# Patient Record
Sex: Female | Born: 1985 | Hispanic: Yes | Marital: Married | Smoking: Never smoker
Health system: Southern US, Community
[De-identification: ages and names within clinical notes are randomized; demographics above are authoritative.]

## PROBLEM LIST (undated history)

## (undated) DIAGNOSIS — R011 Cardiac murmur, unspecified: Secondary | ICD-10-CM

## (undated) HISTORY — DX: Cardiac murmur, unspecified: R01.1

## (undated) HISTORY — PX: WISDOM TOOTH EXTRACTION: SHX21

---

## 2016-10-18 ENCOUNTER — Encounter: Payer: Self-pay | Admitting: Family Medicine

## 2016-12-18 ENCOUNTER — Encounter: Payer: Self-pay | Admitting: Family Medicine

## 2016-12-18 ENCOUNTER — Ambulatory Visit (INDEPENDENT_AMBULATORY_CARE_PROVIDER_SITE_OTHER): Payer: 59 | Admitting: Family Medicine

## 2016-12-18 VITALS — BP 121/80 | HR 65 | Temp 97.8°F | Resp 16 | Ht 63.39 in | Wt 155.4 lb

## 2016-12-18 DIAGNOSIS — M62838 Other muscle spasm: Secondary | ICD-10-CM | POA: Diagnosis not present

## 2016-12-18 DIAGNOSIS — Z833 Family history of diabetes mellitus: Secondary | ICD-10-CM

## 2016-12-18 DIAGNOSIS — Z124 Encounter for screening for malignant neoplasm of cervix: Secondary | ICD-10-CM

## 2016-12-18 DIAGNOSIS — Z0000 Encounter for general adult medical examination without abnormal findings: Secondary | ICD-10-CM | POA: Diagnosis not present

## 2016-12-18 NOTE — Patient Instructions (Signed)
     IF you received an x-ray today, you will receive an invoice from [facility]. Please contact [facility] at [phone] with questions or concerns regarding your invoice.   IF you received labwork today, you will receive an invoice from [facility]. Please contact [facility] at [phone] with questions or concerns regarding your invoice.   Our billing staff will not be able to assist you with questions regarding bills from these companies.  You will be contacted with the lab results as soon as they are available. The fastest way to get your results is to activate your My Chart account. Instructions are located on the last page of this paperwork. If you have not heard from us regarding the results in 2 weeks, please contact this office.     

## 2016-12-18 NOTE — Progress Notes (Signed)
Chief Complaint  Patient presents with  . New Patient (Initial Visit)    establish care    HPI   New patient with new concern of chronic back pain for more than 6 months related to sitting for long periods and teds to be in the mid-thoracic.  This is her first evaluation for this pain.  She is mostly at a computer and does typing and phone calls.  She reports that it is hard to get a comfortable position to sleep.   She has never had a pap smear  She reports that she had abnormal periods Patient's last menstrual period was 09/17/2016. She reports that now her periods occur every 3-4 months G0P0 Sexually active with female spouse  Her last eye exam was 12 months ago Dental exam not up to date Exercise: 3-4 times a week   No past medical history on file.  No current outpatient prescriptions on file.   No current facility-administered medications for this visit.     Allergies: No Known Allergies  No past surgical history on file.  Social History   Social History  . Marital status: Married    Spouse name: N/A  . Number of children: N/A  . Years of education: N/A   Social History Main Topics  . Smoking status: Light Tobacco Smoker    Types: Cigarettes  . Smokeless tobacco: Never Used     Comment: 1-2 cigarettes a month in college, no longer smoking  . Alcohol use 0.6 oz/week    1 Cans of beer per week     Comment: one beer per month  . Drug use: No  . Sexual activity: Yes   Other Topics Concern  . None   Social History Narrative  . None   Family History  Problem Relation Age of Onset  . Idiopathic pulmonary fibrosis Mother   . Diabetes Father   . Diabetes Brother   . Cancer Paternal Grandfather   . Alzheimer's disease Paternal Grandfather     Review of Systems  Constitutional: Negative for chills, fever and weight loss.  Eyes: Negative for blurred vision, double vision and pain.  Respiratory: Negative for cough, shortness of breath and wheezing.    Cardiovascular: Negative for chest pain, palpitations and leg swelling.  Gastrointestinal: Negative for abdominal pain, diarrhea, heartburn, nausea and vomiting.  Genitourinary: Negative for dysuria, hematuria and urgency.  Musculoskeletal: Positive for back pain.  Skin: Negative for itching and rash.  Neurological: Negative for dizziness, tingling, tremors and headaches.  Psychiatric/Behavioral: Negative for depression. The patient is not nervous/anxious and does not have insomnia.     Objective: Vitals:   12/18/16 1652  BP: 121/80  Pulse: 65  Resp: 16  Temp: 97.8 F (36.6 C)  TempSrc: Oral  SpO2: 97%  Weight: 155 lb 6.4 oz (70.5 kg)  Height: 5' 3.39" (1.61 m)  Body mass index is 27.19 kg/m.   Physical Exam  Constitutional: She is oriented to person, place, and time. She appears well-developed and well-nourished.  HENT:  Head: Normocephalic and atraumatic.  Right Ear: External ear normal.  Left Ear: External ear normal.  Nose: Nose normal.  Mouth/Throat: Oropharynx is clear and moist.  Eyes: Conjunctivae and EOM are normal.  Neck: Normal range of motion. No thyromegaly present.  Cardiovascular: Normal rate, regular rhythm and normal heart sounds.   Pulmonary/Chest: Effort normal and breath sounds normal. No respiratory distress. She has no wheezes. She has no rales.  Abdominal: Soft. Bowel sounds are normal.  Musculoskeletal:       Back:  Neurological: She is alert and oriented to person, place, and time.   Vaginal exam Chaperone present Labia normal bilaterally without skin lesions Urethral meatus normal appearing without erythema Vagina with discharge, no odor Deferred bimanual exam due to patient discomfort  Assessment and Plan Jearld AdjutantMarina was seen today for new patient (initial visit).  Diagnoses and all orders for this visit:  Encounter for health maintenance examination in adult- age appropriate screenings reviewed -     Hemoglobin A1c -     Lipid panel -      TSH  Papanicolaou smear for cervical cancer screening -     Pap IG w/ reflex to HPV when ASC-U  Family history of diabetes mellitus -     Hemoglobin A1c  Trapezius muscle spasm- discussed physical therapy and posture -     Ambulatory referral to Physical Therapy     Keeton Kassebaum A Creta LevinStallings

## 2016-12-19 LAB — PAP IG W/ RFLX HPV ASCU: PAP Smear Comment: 0

## 2016-12-19 LAB — LIPID PANEL
CHOLESTEROL TOTAL: 169 mg/dL (ref 100–199)
Chol/HDL Ratio: 2.8 ratio (ref 0.0–4.4)
HDL: 61 mg/dL (ref >39)
LDL Calculated: 90 mg/dL (ref 0–99)
TRIGLYCERIDES: 89 mg/dL (ref 0–149)
VLDL Cholesterol Cal: 18 mg/dL (ref 5–40)

## 2016-12-19 LAB — HEMOGLOBIN A1C
ESTIMATED AVERAGE GLUCOSE: 103 mg/dL
HEMOGLOBIN A1C: 5.2 % (ref 4.8–5.6)

## 2016-12-19 LAB — TSH: TSH: 1.72 u[IU]/mL (ref 0.450–4.500)

## 2016-12-22 ENCOUNTER — Other Ambulatory Visit: Payer: Self-pay | Admitting: Family Medicine

## 2016-12-22 MED ORDER — FLUCONAZOLE 150 MG PO TABS: 150.0000 mg | ORAL_TABLET | Freq: Once | ORAL | 0 refills | Status: AC

## 2016-12-22 NOTE — Progress Notes (Signed)
Diflucan sent in

## 2017-02-19 DIAGNOSIS — M2560 Stiffness of unspecified joint, not elsewhere classified: Secondary | ICD-10-CM | POA: Diagnosis not present

## 2018-01-29 NOTE — Progress Notes (Signed)
Chief Complaint  Patient presents with  . pain in left side of lower back    Onset: tuesday night pain started on left lower back then moved to right side of lower back then to mid abdomen- per pt she thought she would have to go to ED due to the severity of the pain but by Wed. morning pain was gone.  Per pt 1st felt pain in July but went away    HPI  Patient reports that she has been having pain in her abdomen that moves around She states that the first episode was July  She states that this episode was 3 days ago and last 2 days The pain was sharp and moved to her back at times   History reviewed. No pertinent past medical history.  Current Outpatient Medications  Medication Sig Dispense Refill  . ibuprofen (ADVIL,MOTRIN) 200 MG tablet Take 200 mg by mouth every 6 (six) hours as needed.     No current facility-administered medications for this visit.     Allergies: No Known Allergies  History reviewed. No pertinent surgical history.  Social History   Socioeconomic History  . Marital status: Married    Spouse name: Not on file  . Number of children: Not on file  . Years of education: Not on file  . Highest education level: Not on file  Occupational History  . Not on file  Social Needs  . Financial resource strain: Not on file  . Food insecurity:    Worry: Not on file    Inability: Not on file  . Transportation needs:    Medical: Not on file    Non-medical: Not on file  Tobacco Use  . Smoking status: Light Tobacco Smoker    Types: Cigarettes  . Smokeless tobacco: Never Used  . Tobacco comment: 1-2 cigarettes a month in college, no longer smoking  Substance and Sexual Activity  . Alcohol use: Yes    Alcohol/week: 1.0 standard drinks    Types: 1 Cans of beer per week    Comment: one beer per month  . Drug use: No  . Sexual activity: Yes  Lifestyle  . Physical activity:    Days per week: Not on file    Minutes per session: Not on file  . Stress: Not on  file  Relationships  . Social connections:    Talks on phone: Not on file    Gets together: Not on file    Attends religious service: Not on file    Active member of club or organization: Not on file    Attends meetings of clubs or organizations: Not on file    Relationship status: Not on file  Other Topics Concern  . Not on file  Social History Narrative  . Not on file    Family History  Problem Relation Age of Onset  . Idiopathic pulmonary fibrosis Mother   . Diabetes Father   . Diabetes Brother   . Cancer Paternal Grandfather   . Alzheimer's disease Paternal Grandfather      ROS Review of Systems See HPI Constitution: No fevers or chills No malaise No diaphoresis Skin: No rash or itching Eyes: no blurry vision, no double vision GU: no dysuria or hematuria Neuro: no dizziness or headaches all others reviewed and negative   Objective: Vitals:   01/30/18 1220  BP: 132/85  Pulse: 66  Resp: 17  Temp: 98.1 F (36.7 C)  TempSrc: Oral  SpO2: 99%  Weight: 163 lb (  73.9 kg)  Height: 5' 3.39" (1.61 m)    Physical Exam  Constitutional: She is oriented to person, place, and time. She appears well-developed and well-nourished.  HENT:  Head: Normocephalic and atraumatic.  Eyes: Conjunctivae and EOM are normal.  Cardiovascular: Normal rate, regular rhythm and normal heart sounds.  No murmur heard. Pulmonary/Chest: Effort normal and breath sounds normal. No stridor. No respiratory distress.  Neurological: She is alert and oriented to person, place, and time.  Skin: Skin is warm. Capillary refill takes less than 2 seconds.  Psychiatric: She has a normal mood and affect. Her behavior is normal. Judgment and thought content normal.    Assessment and Plan Jearld AdjutantMarina was seen today for pain in left side of lower back.  Diagnoses and all orders for this visit:  Generalized abdominal pain -     DG Abd 1 View; Future   -  Shows gas in the colon  Will recommend pepcid  and metamucil for 2 weeks  Hampton Wixom A Chloe Flis

## 2018-01-30 ENCOUNTER — Encounter: Payer: Self-pay | Admitting: Family Medicine

## 2018-01-30 ENCOUNTER — Ambulatory Visit: Payer: 59 | Admitting: Family Medicine

## 2018-01-30 ENCOUNTER — Other Ambulatory Visit: Payer: Self-pay

## 2018-01-30 ENCOUNTER — Ambulatory Visit (INDEPENDENT_AMBULATORY_CARE_PROVIDER_SITE_OTHER): Payer: 59

## 2018-01-30 VITALS — BP 132/85 | HR 66 | Temp 98.1°F | Resp 17 | Ht 63.39 in | Wt 163.0 lb

## 2018-01-30 DIAGNOSIS — R1084 Generalized abdominal pain: Secondary | ICD-10-CM | POA: Diagnosis not present

## 2018-01-30 DIAGNOSIS — R109 Unspecified abdominal pain: Secondary | ICD-10-CM | POA: Diagnosis not present

## 2018-01-30 NOTE — Patient Instructions (Addendum)
Gas distention Try metamucil twice a day before meals for two weeks pepcid every morning for a week  Increase water intake   If you have lab work done today you will be contacted with your lab results within the next 2 weeks.  If you have not heard from us then please contact us. The fastest way to get your results is to register for My Chart.   IF you received an x-ray today, you will receive an invoice from [facility]. Please contact [facility] at [phone] with questions or concerns regarding your invoice.   IF you received labwork today, you will receive an invoice from [facility]. Please contact [facility] at [phone] with questions or concerns regarding your invoice.   Our billing staff will not be able to assist you with questions regarding bills from these companies.  You will be contacted with the lab results as soon as they are available. The fastest way to get your results is to activate your My Chart account. Instructions are located on the last page of this paperwork. If you have not heard from us regarding the results in 2 weeks, please contact this office.

## 2018-04-15 ENCOUNTER — Encounter: Payer: Self-pay | Admitting: Family Medicine

## 2018-04-15 ENCOUNTER — Ambulatory Visit: Payer: 59 | Admitting: Family Medicine

## 2018-04-15 ENCOUNTER — Other Ambulatory Visit: Payer: Self-pay

## 2018-04-15 DIAGNOSIS — R1032 Left lower quadrant pain: Secondary | ICD-10-CM

## 2018-04-15 LAB — POCT URINALYSIS DIP (MANUAL ENTRY)
BILIRUBIN UA: NEGATIVE
Glucose, UA: NEGATIVE mg/dL
Ketones, POC UA: NEGATIVE mg/dL
Nitrite, UA: NEGATIVE
PH UA: 7 (ref 5.0–8.0)
Protein Ur, POC: 30 mg/dL — AB
SPEC GRAV UA: 1.02 (ref 1.010–1.025)
Urobilinogen, UA: 0.2 E.U./dL

## 2018-04-15 NOTE — Progress Notes (Signed)
Established Patient Office Visit  Subjective:  Patient ID: Tiffany Guerrero, female    DOB: 1986-03-12  Age: 32 y.o. MRN: [id number]  CC:  Chief Complaint  Patient presents with  . Abdominal Pain    pt states the pain has been present since Sept. that starts in the lower back and radiates around into the abdomin. Pt states she feels blotting and then the pain.     HPI Tiffany Guerrero presents for   Patient reports that she still gets pain in her back and abdomen States that when she feels very bloated and gets severe pains and spasms She denies bloody BM She denies nausea or vomiting, fevers or chills.   She recalls that the first episode was back in July then worsened until September then improved and now is back. She has not family history of colon issues. She eats cheese, lots of cheese, drinks almond milk, and tried metamucil capsule once or twice. She tries to increase her water intake.    No past medical history on file.  No past surgical history on file.  Family History  Problem Relation Age of Onset  . Idiopathic pulmonary fibrosis Mother   . Diabetes Father   . Diabetes Brother   . Cancer Paternal Grandfather   . Alzheimer's disease Paternal Grandfather     Social History   Socioeconomic History  . Marital status: Married    Spouse name: Not on file  . Number of children: Not on file  . Years of education: Not on file  . Highest education level: Not on file  Occupational History  . Not on file  Social Needs  . Financial resource strain: Not on file  . Food insecurity:    Worry: Not on file    Inability: Not on file  . Transportation needs:    Medical: Not on file    Non-medical: Not on file  Tobacco Use  . Smoking status: Light Tobacco Smoker    Types: Cigarettes  . Smokeless tobacco: Never Used  . Tobacco comment: 1-2 cigarettes a month in college, no longer smoking  Substance and Sexual Activity  . Alcohol use: Yes   Alcohol/week: 1.0 standard drinks    Types: 1 Cans of beer per week    Comment: one beer per month  . Drug use: No  . Sexual activity: Yes  Lifestyle  . Physical activity:    Days per week: Not on file    Minutes per session: Not on file  . Stress: Not on file  Relationships  . Social connections:    Talks on phone: Not on file    Gets together: Not on file    Attends religious service: Not on file    Active member of club or organization: Not on file    Attends meetings of clubs or organizations: Not on file    Relationship status: Not on file  . Intimate partner violence:    Fear of current or ex partner: Not on file    Emotionally abused: Not on file    Physically abused: Not on file    Forced sexual activity: Not on file  Other Topics Concern  . Not on file  Social History Narrative  . Not on file    Outpatient Medications Prior to Visit  Medication Sig Dispense Refill  . ibuprofen (ADVIL,MOTRIN) 200 MG tablet Take 200 mg by mouth every 6 (six) hours as needed.     No facility-administered medications prior to visit.  No Known Allergies  ROS Review of Systems Review of Systems  Constitutional: Negative for activity change, appetite change, chills and fever.  HENT: Negative for congestion, nosebleeds, trouble swallowing and voice change.   Respiratory: Negative for cough, shortness of breath and wheezing.   Gastrointestinal: see hpi  Genitourinary: Negative for difficulty urinating, dysuria, flank pain and hematuria.  Musculoskeletal: Negative for back pain, joint swelling and neck pain.  Neurological: Negative for dizziness, speech difficulty, light-headedness and numbness.  See HPI. All other review of systems negative.     Objective:   BP 124/85   Pulse 73   Temp 98 F (36.7 C) (Oral)   Resp 16   Ht 5' 4.17" (1.63 m)   Wt 158 lb (71.7 kg)   LMP 03/18/2018 (Approximate)   SpO2 98%   BMI 26.97 kg/m  Wt Readings from Last 3 Encounters:  04/15/18  158 lb (71.7 kg)  01/30/18 163 lb (73.9 kg)  12/18/16 155 lb 6.4 oz (70.5 kg)   Physical Exam  Constitutional: She appears well-developed and well-nourished.  HENT:  Head: Normocephalic and atraumatic.  Cardiovascular: Normal rate, regular rhythm and normal heart sounds.  No murmur heard. Pulmonary/Chest: Effort normal and breath sounds normal. No respiratory distress. She has no wheezes.  Abdominal: Normal appearance.       Health Maintenance Due  Topic Date Due  . HIV Screening  03/22/2003  . TETANUS/TDAP  03/22/2007    There are no preventive care reminders to display for this patient.  Lab Results  Component Value Date   TSH 1.720 12/18/2016   Lab Results  Component Value Date   WBC 7.7 04/15/2018   HGB 13.2 04/15/2018   HCT 37.5 04/15/2018   MCV 92 04/15/2018   PLT 257 04/15/2018   Lab Results  Component Value Date   NA 137 04/15/2018   K 4.3 04/15/2018   CO2 19 (L) 04/15/2018   GLUCOSE 95 04/15/2018   BUN 9 04/15/2018   CREATININE 0.58 04/15/2018   BILITOT 0.4 04/15/2018   ALKPHOS 82 04/15/2018   AST 34 04/15/2018   ALT 55 (H) 04/15/2018   PROT 6.8 04/15/2018   ALBUMIN 4.5 04/15/2018   CALCIUM 9.5 04/15/2018   Lab Results  Component Value Date   CHOL 169 12/18/2016   Lab Results  Component Value Date   HDL 61 12/18/2016   Lab Results  Component Value Date   LDLCALC 90 12/18/2016   Lab Results  Component Value Date   TRIG 89 12/18/2016   Lab Results  Component Value Date   CHOLHDL 2.8 12/18/2016   Lab Results  Component Value Date   HGBA1C 5.2 12/18/2016      Assessment & Plan:   Problem List Items Addressed This Visit      Other   Left lower quadrant abdominal pain    Previously treated for gas and obstipation. Advised patient to follow up with CT abdomen as the pain is vague but persistent and does not seem to have triggers or aggravating factors. Her age and lack of risk factors were reassuring thus an outpatient evaluation  was advised with CT abdomen/pelvs with contrast.       Relevant Orders   CT Abdomen Pelvis W Contrast   CBC (Completed)   CMP14+EGFR (Completed)   Allergen Profile, Food-Milk (Completed)   POCT urinalysis dipstick (Completed)      No orders of the defined types were placed in this encounter.   Follow-up: No follow-ups on file.  Ulyana Pitones A Kenesha Moshier, MD 

## 2018-04-15 NOTE — Patient Instructions (Addendum)
If you have lab work done today you will be contacted with your lab results within the next 2 weeks.  If you have not heard from us then please contact us. The fastest way to get your results is to register for My Chart.   IF you received an x-ray today, you will receive an invoice from [facility]. Please contact [facility] at [phone] with questions or concerns regarding your invoice.   IF you received labwork today, you will receive an invoice from [facility]. Please contact [facility] at [phone] with questions or concerns regarding your invoice.   Our billing staff will not be able to assist you with questions regarding bills from these companies.  You will be contacted with the lab results as soon as they are available. The fastest way to get your results is to activate your My Chart account. Instructions are located on the last page of this paperwork. If you have not heard from us regarding the results in 2 weeks, please contact this office.      Abdominal Bloating When you have abdominal bloating, your abdomen may feel full, tight, or painful. It may also look bigger than normal or swollen (distended). Common causes of abdominal bloating include:  Swallowing air.  Constipation.  Problems digesting food.  Eating too much.  Irritable bowel syndrome. This is a condition that affects the large intestine.  Lactose intolerance. This is an inability to digest lactose, a natural sugar in dairy products.  Celiac disease. This is a condition that affects the ability to digest gluten, a protein found in some grains.  Gastroparesis. This is a condition that slows down the movement of food in the stomach and small intestine. It is more common in people with diabetes mellitus.  Gastroesophageal reflux disease (GERD). This is a digestive condition that makes stomach acid flow back into the esophagus.  Urinary retention. This means that the body is holding  onto urine, and the bladder cannot be emptied all the way.  Follow these instructions at home: Eating and drinking  Avoid eating too much.  Try not to swallow air while talking or eating.  Avoid eating while lying down.  Avoid these foods and drinks: ? Foods that cause gas, such as broccoli, cabbage, cauliflower, and baked beans. ? Carbonated drinks. ? Hard candy. ? Chewing gum. Medicines  Take over-the-counter and prescription medicines only as told by your health care provider.  Take probiotic medicines. These medicines contain live bacteria or yeasts that can help digestion.  Take coated peppermint oil capsules. Activity  Try to exercise regularly. Exercise may help to relieve bloating that is caused by gas and relieve constipation. General instructions  Keep all follow-up visits as told by your health care provider. This is important. Contact a health care provider if:  You have nausea and vomiting.  You have diarrhea.  You have abdominal pain.  You have unusual weight loss or weight gain.  You have severe pain, and medicines do not help. Get help right away if:  You have severe chest pain.  You have trouble breathing.  You have shortness of breath.  You have trouble urinating.  You have darker urine than normal.  You have blood in your stools or have dark, tarry stools. Summary  Abdominal bloating means that the abdomen is swollen.  Common causes of abdominal bloating are swallowing air, constipation, and problems digesting food.  Avoid eating too much and avoid swallowing air.  Avoid foods that cause  gas, carbonated drinks, hard candy, and chewing gum. This information is not intended to replace advice given to you by your health care provider. Make sure you discuss any questions you have with your health care provider. Document Released: 06/07/2016 Document Revised: 06/07/2016 Document Reviewed: 06/07/2016 Elsevier Interactive Patient Education   [employer].

## 2018-04-16 ENCOUNTER — Encounter: Payer: Self-pay | Admitting: Family Medicine

## 2018-04-17 DIAGNOSIS — R1032 Left lower quadrant pain: Secondary | ICD-10-CM | POA: Insufficient documentation

## 2018-04-17 NOTE — Assessment & Plan Note (Signed)
Previously treated for gas and obstipation. Advised patient to follow up with CT abdomen as the pain is vague but persistent and does not seem to have triggers or aggravating factors. Her age and lack of risk factors were reassuring thus an outpatient evaluation was advised with CT abdomen/pelvs with contrast.

## 2018-04-18 LAB — ALLERGEN PROFILE, FOOD-MILK
F076-IgE Alpha Lactalbumin: <0.1 kU/L
F081-IgE Cheese, Cheddar Type: <0.1 kU/L
Milk IgE: <0.1 kU/L

## 2018-04-18 LAB — CMP14+EGFR
ALBUMIN: 4.5 g/dL (ref 3.5–5.5)
ALT: 55 IU/L — ABNORMAL HIGH (ref 0–32)
AST: 34 IU/L (ref 0–40)
Albumin/Globulin Ratio: 2 (ref 1.2–2.2)
Alkaline Phosphatase: 82 IU/L (ref 39–117)
BUN / CREAT RATIO: 16 (ref 9–23)
BUN: 9 mg/dL (ref 6–20)
Bilirubin Total: 0.4 mg/dL (ref 0.0–1.2)
CALCIUM: 9.5 mg/dL (ref 8.7–10.2)
CO2: 19 mmol/L — AB (ref 20–29)
CREATININE: 0.58 mg/dL (ref 0.57–1.00)
Chloride: 103 mmol/L (ref 96–106)
GFR, EST AFRICAN AMERICAN: 143 mL/min/{1.73_m2} (ref >59)
GFR, EST NON AFRICAN AMERICAN: 124 mL/min/{1.73_m2} (ref >59)
Globulin, Total: 2.3 g/dL (ref 1.5–4.5)
Glucose: 95 mg/dL (ref 65–99)
Potassium: 4.3 mmol/L (ref 3.5–5.2)
Sodium: 137 mmol/L (ref 134–144)
TOTAL PROTEIN: 6.8 g/dL (ref 6.0–8.5)

## 2018-04-18 LAB — CBC
HEMOGLOBIN: 13.2 g/dL (ref 11.1–15.9)
Hematocrit: 37.5 % (ref 34.0–46.6)
MCH: 32.4 pg (ref 26.6–33.0)
MCHC: 35.2 g/dL (ref 31.5–35.7)
MCV: 92 fL (ref 79–97)
Platelets: 257 10*3/uL (ref 150–450)
RBC: 4.07 x10E6/uL (ref 3.77–5.28)
RDW: 12.8 % (ref 12.3–15.4)
WBC: 7.7 10*3/uL (ref 3.4–10.8)

## 2018-05-06 ENCOUNTER — Ambulatory Visit
Admission: RE | Admit: 2018-05-06 | Discharge: 2018-05-06 | Disposition: A | Discharge status: Home / Self Care | Payer: 59 | Source: Ambulatory Visit | Attending: Family Medicine | Admitting: Family Medicine

## 2018-05-06 DIAGNOSIS — R1032 Left lower quadrant pain: Secondary | ICD-10-CM

## 2018-05-06 DIAGNOSIS — K760 Fatty (change of) liver, not elsewhere classified: Secondary | ICD-10-CM | POA: Diagnosis not present

## 2018-05-06 MED ORDER — IOPAMIDOL (ISOVUE-300) INJECTION 61%
100.0000 mL | Freq: Once | INTRAVENOUS | Status: AC | PRN
  Administered 2018-05-06: 100 mL via INTRAVENOUS

## 2018-07-08 ENCOUNTER — Telehealth: Payer: Self-pay | Admitting: Family Medicine

## 2018-07-08 NOTE — Telephone Encounter (Signed)
Called to regarding their appt with Dr. Creta Levin on 4/4. I was able to reschedule them for 3/30 with Dr. Creta Levin. I advised of time and late policy. Pt acknowledged.

## 2018-08-11 ENCOUNTER — Other Ambulatory Visit: Payer: Self-pay | Admitting: *Deleted

## 2018-08-17 ENCOUNTER — Ambulatory Visit: Payer: 59 | Admitting: Family Medicine

## 2018-08-22 ENCOUNTER — Ambulatory Visit: Payer: 59 | Admitting: Family Medicine

## 2018-11-16 ENCOUNTER — Other Ambulatory Visit: Payer: Self-pay

## 2018-11-16 ENCOUNTER — Encounter (HOSPITAL_COMMUNITY): Payer: Self-pay

## 2018-11-16 ENCOUNTER — Ambulatory Visit (HOSPITAL_COMMUNITY)
Admission: EM | Admit: 2018-11-16 | Discharge: 2018-11-16 | Disposition: A | Discharge status: Home / Self Care | Payer: 59 | Attending: Family Medicine | Admitting: Family Medicine

## 2018-11-16 DIAGNOSIS — R1010 Upper abdominal pain, unspecified: Secondary | ICD-10-CM | POA: Diagnosis not present

## 2018-11-16 DIAGNOSIS — R42 Dizziness and giddiness: Secondary | ICD-10-CM | POA: Diagnosis not present

## 2018-11-16 NOTE — ED Provider Notes (Signed)
[facility]    CSN: [id number] Arrival date & time: 11/16/18  [id number]     History   Chief Complaint Chief Complaint  Patient presents with  . Gastroesophageal Reflux  . Vertigo    HPI Tiffany Guerrero is a 33 y.o. female presenting for abdominal discomfort and dizziness.  Patient states that she has had abdominal discomfort, described as full, pressure, bloating on her upper abdomen, off and on for a few years now.  Patient states her last time having this was in December.  Was evaluated via CT which was significant for hepatic fatty infiltrates.  Patient has tried remedies intermittently including Metamucil, Gas-X, OTC antacids with minimal relief.  Patient denies increased belching or flatus, nausea, vomiting, diarrhea, hematochezia, melena, mucosal discharge mixed in stool when she has the symptoms.  Patient is asymptomatic in office today, states that her last episode was last night.  Patient denies change in bowel habit: Currently 2 bowel movements daily that are nonpainful.  Times patient states that she feels like she needs to have a bowel movement but is unable to, the states this does not seem to correlate with her upper abdominal symptoms.  Patient has not noticed any exacerbating factors for her symptoms.  Patient also states that over the weekend she had a few episodes of dizziness.  Patient states that this tended to happen when she was going from sitting to standing, or laying to standing.  Patient denies loss of consciousness, change in vision, tinnitus, lightheadedness, chest pain or palpitations during these events.  Patient denies the room spinning.  Patient not tried anything for this.  No known history of hypertension or orthostasis.  Of note, patient does have follow-up with her PCP on Wednesday, intends on keeping this appointment.   History reviewed. No pertinent past medical history.  Patient Active Problem List   Diagnosis Date Noted  . Left lower  quadrant abdominal pain 04/17/2018    History reviewed. No pertinent surgical history.  OB History   No obstetric history on file.      Home Medications    Prior to Admission medications   Medication Sig Start Date End Date Taking? Authorizing Provider  ibuprofen (ADVIL,MOTRIN) 200 MG tablet Take 200 mg by mouth every 6 (six) hours as needed.    [provider]    Family History Family History  Problem Relation Age of Onset  . Idiopathic pulmonary fibrosis Mother   . Diabetes Father   . Diabetes Brother   . Cancer Paternal Grandfather   . Alzheimer's disease Paternal Grandfather     Social History Social History   Tobacco Use  . Smoking status: Light Tobacco Smoker    Types: Cigarettes  . Smokeless tobacco: Never Used  . Tobacco comment: 1-2 cigarettes a month in college, no longer smoking  Substance Use Topics  . Alcohol use: Yes    Alcohol/week: 1.0 standard drinks    Types: 1 Cans of beer per week    Comment: one beer per month  . Drug use: No     Allergies   Patient has no known allergies.   Review of Systems As per HPI   Physical Exam Triage Vital Signs ED Triage Vitals [11/16/18 1004]  Enc Vitals Group     BP (!) 143/66     Pulse Rate 62     Resp 18     Temp 98.6 F (37 C)     Temp Source Oral     SpO2 99 %  Weight      Height      Head Circumference      Peak Flow      Pain Score      Pain Loc      Pain Edu?      Excl. in [place]?    Orthostatic VS for the past 24 hrs:  BP- Lying Pulse- Lying BP- Sitting Pulse- Sitting BP- Standing at 0 minutes Pulse- Standing at 0 minutes  11/16/18 1051 111/79 53 121/79 60 130/84 67    Updated Vital Signs BP (!) 143/66 (BP Location: Left Arm)   Pulse 62   Temp 98.6 F (37 C) (Oral)   Resp 18   LMP  (LMP Unknown)   SpO2 99%   Visual Acuity Right Eye Distance:   Left Eye Distance:   Bilateral Distance:    Right Eye Near:   Left Eye Near:    Bilateral Near:     Physical Exam  Constitutional:      General: She is not in acute distress. HENT:     Head: Normocephalic and atraumatic.     Right Ear: Tympanic membrane, ear canal and external ear normal.     Left Ear: Tympanic membrane, ear canal and external ear normal.     Nose: Nose normal.     Mouth/Throat:     Mouth: Mucous membranes are moist.     Pharynx: Oropharynx is clear.  Eyes:     General: No scleral icterus.       Right eye: No discharge.        Left eye: No discharge.     Extraocular Movements: Extraocular movements intact.     Conjunctiva/sclera: Conjunctivae normal.     Pupils: Pupils are equal, round, and reactive to light.     Comments: Negative nystagmus with EOM.  Vertiginous symptoms not replicated on exam.  Neck:     Musculoskeletal: Normal range of motion. No muscular tenderness.  Cardiovascular:     Rate and Rhythm: Normal rate.  Pulmonary:     Effort: Pulmonary effort is normal.  Abdominal:     General: Bowel sounds are normal. There is no distension.     Palpations: Abdomen is soft. There is no mass.     Tenderness: There is no abdominal tenderness. There is no right CVA tenderness, left CVA tenderness or guarding.     Hernia: No hernia is present.  Musculoskeletal:     Comments: Full active range of motion of upper and lower extremities with 5/5 strength bilaterally and symmetric.  Lymphadenopathy:     Cervical: No cervical adenopathy.  Skin:    General: Skin is warm.     Capillary Refill: Capillary refill takes less than 2 seconds.     Coloration: Skin is not jaundiced or pale.     Findings: No bruising.  Neurological:     General: No focal deficit present.     Mental Status: She is alert and oriented to person, place, and time.     Cranial Nerves: No cranial nerve deficit.     Sensory: No sensory deficit.     Motor: No weakness.     Coordination: Coordination normal.     Gait: Gait normal.     Deep Tendon Reflexes: Reflexes normal.  Psychiatric:        Mood and  Affect: Mood normal.        Thought Content: Thought content normal.      UC Treatments / Results  Labs (  all labs ordered are listed, but only abnormal results are displayed) Labs Reviewed - No data to display  EKG None  Radiology No results found.  Procedures Procedures (including critical care time)  Medications Ordered in UC Medications - No data to display  Initial Impression / Assessment and Plan / UC Course  I have reviewed the triage vital signs and the nursing notes.  Pertinent labs & imaging results that were available during my care of the patient were reviewed by me and considered in my medical decision making (see chart for details).     33 year old female presenting for generalized upper abdominal pressure/discomfort.  Patient has had previous eval with CT for similar symptoms that was unremarkable.  Patient intended to follow-up with primary care regarding further management.  Likely attributed to gas, lower concern for pancreatitis, cholelithiasis, SIBO.  Patient is asymptomatic today, encouraged symptom log in the interim.   Patient to trial meclizine for dizziness and will update her PCP on its efficacy.  Orthostatic was performed in office: Systolic raises by nearly 10 mmHg between lying to sitting to standing, low concern for orthostatic hypotension.  Patient will follow-up with PCP on Wednesday, keep symptom log the interim. Final Clinical Impressions(s) / UC Diagnoses   Final diagnoses:  Dizziness  Pain of upper abdomen     Discharge Instructions     Keep symptom log of your abdominal discomfort and dizziness for review with your PCP. Keep your appointment this week with PCP for further evaluation. May try meclizine as needed for dizziness, can follow-up with PCP regarding efficacy. Return if you develop worsening symptoms, blood in your urine, issues urinating or defecating, fever.    ED Prescriptions    None     Controlled Substance  Prescriptions [place] Controlled Substance Registry consulted? Not Applicable   Shea EvansHall-Potvin, , [place] 11/16/18 1359

## 2018-11-16 NOTE — Discharge Instructions (Addendum)
Keep symptom log of your abdominal discomfort and dizziness for review with your PCP. Keep your appointment this week with PCP for further evaluation. May try meclizine as needed for dizziness, can follow-up with PCP regarding efficacy. Return if you develop worsening symptoms, blood in your urine, issues urinating or defecating, fever.

## 2018-11-16 NOTE — ED Triage Notes (Signed)
Pt presents with symptoms of acid reflux; flank pain, pain and & tightness after eating in upper abdominal area. Pt states she is also experiencing vertigo sometimes when switching movements or getting up really fast.

## 2018-11-17 ENCOUNTER — Other Ambulatory Visit: Payer: Self-pay

## 2018-11-17 ENCOUNTER — Encounter: Payer: Self-pay | Admitting: Emergency Medicine

## 2018-11-17 ENCOUNTER — Ambulatory Visit: Payer: 59 | Admitting: Emergency Medicine

## 2018-11-17 VITALS — BP 118/59 | HR 66 | Temp 98.6°F | Resp 16 | Wt 159.8 lb

## 2018-11-17 DIAGNOSIS — R42 Dizziness and giddiness: Secondary | ICD-10-CM | POA: Diagnosis not present

## 2018-11-17 DIAGNOSIS — R011 Cardiac murmur, unspecified: Secondary | ICD-10-CM | POA: Diagnosis not present

## 2018-11-17 NOTE — Patient Instructions (Addendum)
If you have lab work done today you will be contacted with your lab results within the next 2 weeks.  If you have not heard from [place] then please contact us. The fastest way to get your results is to register for My Chart.   IF you received an x-ray today, you will receive an invoice from [facility]. Please contact [facility] at [phone] with questions or concerns regarding your invoice.   IF you received labwork today, you will receive an invoice from [facility]. Please contact [facility] at [phone] with questions or concerns regarding your invoice.   Our billing staff will not be able to assist you with questions regarding bills from these companies.  You will be contacted with the lab results as soon as they are available. The fastest way to get your results is to activate your My Chart account. Instructions are located on the last page of this paperwork. If you have not heard from [place] regarding the results in 2 weeks, please contact this office.     Vertigo Vertigo is the feeling that you or the things around you are moving when they are not. This feeling can come and go at any time. Vertigo often goes away on its own. This condition can be dangerous if it happens when you are doing activities like driving or working with machines. Your doctor will do tests to find the cause of your vertigo. These tests will also help your doctor decide on the best treatment for you. Follow these instructions at home: Eating and drinking      Drink enough fluid to keep your pee (urine) pale yellow.  Do not drink alcohol. Activity  Return to your normal activities as told by your doctor. Ask your doctor what activities are safe for you.  In the morning, first sit up on the side of the bed. When you feel okay, stand slowly while you hold onto something until you know that your balance is fine.  Move slowly. Avoid sudden body or head movements or certain positions, as  told by your doctor.  Use a cane if you have trouble standing or walking.  Sit down right away if you feel dizzy.  Avoid doing any tasks or activities that can cause danger to you or others if you get dizzy.  Avoid bending down if you feel dizzy. Place items in your home so that they are easy for you to reach without leaning over.  Do not drive or use heavy machinery if you feel dizzy. General instructions  Take over-the-counter and prescription medicines only as told by your doctor.  Keep all follow-up visits as told by your doctor. This is important. Contact a doctor if:  Your medicine does not help your vertigo.  You have a fever.  Your problems get worse or you have new symptoms.  Your family or friends see changes in your behavior.  The feeling of being sick to your stomach gets worse.  Your vomiting gets worse.  You lose feeling (have numbness) in part of your body.  You feel prickling and tingling in a part of your body. Get help right away if:  You have trouble moving or talking.  You are always dizzy.  You pass out (faint).  You get very bad headaches.  You feel weak in your hands, arms, or legs.  You have changes in your hearing.  You have changes in how you see (vision).  You get a stiff neck.  Bright light starts to bother you. Summary  Vertigo is the feeling that you or the things around you are moving when they are not.  Your doctor will do tests to find the cause of your vertigo.  You may be told to avoid some tasks, positions, or movements.  Contact a doctor if your medicine is not helping, or if you have a fever, new symptoms, or a change in behavior.  Get help right away if you get very bad headaches, or if you have changes in how you speak, hear, or see. This information is not intended to replace advice given to you by your health care provider. Make sure you discuss any questions you have with your health care provider. Document  Released: 02/13/2008 Document Revised: 03/30/2018 Document Reviewed: 03/30/2018 Elsevier Patient Education  2020 Elsevier Inc.  

## 2018-11-17 NOTE — Progress Notes (Signed)
Tiffany Guerrero 33 y.o.   Chief Complaint  Patient presents with  . Dizziness    per patient it started Saturday, per patient [facility] and prescribed Meclizine  . Back Pain    Sunday in mid back to belly with pressure    HISTORY OF PRESENT ILLNESS: This is a 33 y.o. female developed vertigo 3 days ago followed by abdominal bloating and back pain the following day.  Was still feeling sick on Monday morning so she went to a local urgent care center.  No significant findings.  Meclizine was prescribed but she never picked it up.  Today she feels about 90% better.  No new symptoms. No chronic medical problems on no chronic medications.  Healthy lifestyle.   HPI   Prior to Admission medications   Not on File    No Known Allergies  Patient Active Problem List   Diagnosis Date Noted  . Left lower quadrant abdominal pain 04/17/2018    No past medical history on file.  No past surgical history on file.  Social History   Socioeconomic History  . Marital status: Married    Spouse name: Not on file  . Number of children: Not on file  . Years of education: Not on file  . Highest education level: Not on file  Occupational History  . Not on file  Social Needs  . Financial resource strain: Not on file  . Food insecurity    Worry: Not on file    Inability: Not on file  . Transportation needs    Medical: Not on file    Non-medical: Not on file  Tobacco Use  . Smoking status: Light Tobacco Smoker    Types: Cigarettes  . Smokeless tobacco: Never Used  . Tobacco comment: 1-2 cigarettes a month in college, no longer smoking  Substance and Sexual Activity  . Alcohol use: Yes    Alcohol/week: 1.0 standard drinks    Types: 1 Cans of beer per week    Comment: one beer per month  . Drug use: No  . Sexual activity: Yes  Lifestyle  . Physical activity    Days per week: Not on file    Minutes per session: Not on file  . Stress: Not on  file  Relationships  . Social Musicianconnections    Talks on phone: Not on file    Gets together: Not on file    Attends religious service: Not on file    Active member of club or organization: Not on file    Attends meetings of clubs or organizations: Not on file    Relationship status: Not on file  . Intimate partner violence    Fear of current or ex partner: Not on file    Emotionally abused: Not on file    Physically abused: Not on file    Forced sexual activity: Not on file  Other Topics Concern  . Not on file  Social History Narrative  . Not on file    Family History  Problem Relation Age of Onset  . Idiopathic pulmonary fibrosis Mother   . Diabetes Father   . Diabetes Brother   . Cancer Paternal Grandfather   . Alzheimer's disease Paternal Grandfather      Review of Systems  Constitutional: Negative.  Negative for chills and fever.  HENT: Negative.  Negative for congestion, ear discharge, ear pain, hearing loss, nosebleeds, sinus pain, sore throat and tinnitus.   Eyes: Negative.  Negative for blurred vision and double vision.  Respiratory: Negative.  Negative for cough and shortness of breath.   Cardiovascular: Negative.  Negative for chest pain and palpitations.  Gastrointestinal: Negative.  Negative for abdominal pain, diarrhea, nausea and vomiting.  Genitourinary: Negative.  Negative for dysuria and hematuria.  Musculoskeletal: Negative.  Negative for myalgias and neck pain.  Skin: Negative.  Negative for rash.  Neurological: Negative.  Negative for dizziness, sensory change, focal weakness and headaches.  Endo/Heme/Allergies: Negative.   All other systems reviewed and are negative.  Vitals:   11/17/18 1639  BP: (!) 118/59  Pulse: 66  Resp: 16  Temp: 98.6 F (37 C)  SpO2: 100%     Physical Exam Vitals signs reviewed.  Constitutional:      Appearance: Normal appearance.  HENT:     Head: Normocephalic and atraumatic.     Right Ear: Tympanic membrane, ear  canal and external ear normal.     Left Ear: Tympanic membrane, ear canal and external ear normal.     Nose: Nose normal.     Mouth/Throat:     Mouth: Mucous membranes are moist.     Pharynx: Oropharynx is clear.  Eyes:     Extraocular Movements: Extraocular movements intact.     Conjunctiva/sclera: Conjunctivae normal.     Pupils: Pupils are equal, round, and reactive to light.  Neck:     Musculoskeletal: Normal range of motion and neck supple. No muscular tenderness.  Cardiovascular:     Rate and Rhythm: Normal rate. Occasional extrasystoles are present.    Pulses: Normal pulses.     Heart sounds: Murmur present. Systolic murmur present with a grade of 2/6.  Pulmonary:     Effort: Pulmonary effort is normal.     Breath sounds: Normal breath sounds.  Abdominal:     General: There is no distension.     Palpations: Abdomen is soft.     Tenderness: There is no abdominal tenderness.  Musculoskeletal: Normal range of motion.  Lymphadenopathy:     Cervical: No cervical adenopathy.  Skin:    General: Skin is warm and dry.  Neurological:     General: No focal deficit present.     Mental Status: She is alert and oriented to person, place, and time.  Psychiatric:        Mood and Affect: Mood normal.        Behavior: Behavior normal.    A total of 25 minutes was spent in the room with the patient, greater than 50% of which was in counseling/coordination of care regarding differential diagnosis of vertigo, treatment, medication side effects, need for blood work, need for evaluation of heart murmur, prognosis, and need for follow-up.   ASSESSMENT & PLAN: Tiffany Guerrero was seen today for dizziness and back pain.  Diagnoses and all orders for this visit:  Vertigo -     CBC with Differential/Platelet -     Comprehensive metabolic panel  Heart murmur -     Ambulatory referral to Cardiology    Patient Instructions       If you have lab work done today you will be contacted with  your lab results within the next 2 weeks.  If you have not heard from [place] then please contact us. The fastest way to get your results is to register for My Chart.   IF you received an x-ray today, you will receive an invoice from [facility]. Please contact [facility] at [phone] with questions or  concerns regarding your invoice.   IF you received labwork today, you will receive an invoice from [facility]. Please contact [facility] at [phone] with questions or concerns regarding your invoice.   Our billing staff will not be able to assist you with questions regarding bills from these companies.  You will be contacted with the lab results as soon as they are available. The fastest way to get your results is to activate your My Chart account. Instructions are located on the last page of this paperwork. If you have not heard from us regarding the results in 2 weeks, please contact this office.     Vertigo Vertigo is the feeling that you or the things around you are moving when they are not. This feeling can come and go at any time. Vertigo often goes away on its own. This condition can be dangerous if it happens when you are doing activities like driving or working with machines. Your doctor will do tests to find the cause of your vertigo. These tests will also help your doctor decide on the best treatment for you. Follow these instructions at home: Eating and drinking      Drink enough fluid to keep your pee (urine) pale yellow.  Do not drink alcohol. Activity  Return to your normal activities as told by your doctor. Ask your doctor what activities are safe for you.  In the morning, first sit up on the side of the bed. When you feel okay, stand slowly while you hold onto something until you know that your balance is fine.  Move slowly. Avoid sudden body or head movements or certain positions, as told by your doctor.  Use a cane if you have trouble standing or  walking.  Sit down right away if you feel dizzy.  Avoid doing any tasks or activities that can cause danger to you or others if you get dizzy.  Avoid bending down if you feel dizzy. Place items in your home so that they are easy for you to reach without leaning over.  Do not drive or use heavy machinery if you feel dizzy. General instructions  Take over-the-counter and prescription medicines only as told by your doctor.  Keep all follow-up visits as told by your doctor. This is important. Contact a doctor if:  Your medicine does not help your vertigo.  You have a fever.  Your problems get worse or you have new symptoms.  Your family or friends see changes in your behavior.  The feeling of being sick to your stomach gets worse.  Your vomiting gets worse.  You lose feeling (have numbness) in part of your body.  You feel prickling and tingling in a part of your body. Get help right away if:  You have trouble moving or talking.  You are always dizzy.  You pass out (faint).  You get very bad headaches.  You feel weak in your hands, arms, or legs.  You have changes in your hearing.  You have changes in how you see (vision).  You get a stiff neck.  Bright light starts to bother you. Summary  Vertigo is the feeling that you or the things around you are moving when they are not.  Your doctor will do tests to find the cause of your vertigo.  You may be told to avoid some tasks, positions, or movements.  Contact a doctor if your medicine is not helping, or if you have a fever, new symptoms, or a change in behavior.  Get help right away if you get very bad headaches, or if you have changes in how you speak, hear, or see. This information is not intended to replace advice given to you by your health care provider. Make sure you discuss any questions you have with your health care provider. Document Released: 02/13/2008 Document Revised: 03/30/2018 Document Reviewed:  03/30/2018 Elsevier Patient Education  2020 Elsevier Inc.      Edwina BarthMiguel Clearnce Leja, MD [facility]

## 2018-11-18 LAB — CBC WITH DIFFERENTIAL/PLATELET
Basophils Absolute: 0.1 10*3/uL (ref 0.0–0.2)
Basos: 1 %
EOS (ABSOLUTE): 0.1 10*3/uL (ref 0.0–0.4)
Eos: 1 %
Hematocrit: 38 % (ref 34.0–46.6)
Hemoglobin: 13.3 g/dL (ref 11.1–15.9)
Immature Grans (Abs): 0 10*3/uL (ref 0.0–0.1)
Immature Granulocytes: 0 %
Lymphocytes Absolute: 2.9 10*3/uL (ref 0.7–3.1)
Lymphs: 37 %
MCH: 32.1 pg (ref 26.6–33.0)
MCHC: 35 g/dL (ref 31.5–35.7)
MCV: 92 fL (ref 79–97)
Monocytes Absolute: 0.6 10*3/uL (ref 0.1–0.9)
Monocytes: 8 %
Neutrophils Absolute: 4.1 10*3/uL (ref 1.4–7.0)
Neutrophils: 53 %
Platelets: 219 10*3/uL (ref 150–450)
RBC: 4.14 x10E6/uL (ref 3.77–5.28)
RDW: 12.6 % (ref 11.7–15.4)
WBC: 7.9 10*3/uL (ref 3.4–10.8)

## 2018-11-18 LAB — COMPREHENSIVE METABOLIC PANEL
ALT: 38 IU/L — ABNORMAL HIGH (ref 0–32)
AST: 24 IU/L (ref 0–40)
Albumin/Globulin Ratio: 1.7 (ref 1.2–2.2)
Albumin: 4.5 g/dL (ref 3.9–5.0)
Alkaline Phosphatase: 86 IU/L (ref 39–117)
BUN/Creatinine Ratio: 20 (ref 9–23)
BUN: 12 mg/dL (ref 6–20)
Bilirubin Total: 0.4 mg/dL (ref 0.0–1.2)
CO2: 21 mmol/L (ref 20–29)
Calcium: 9.3 mg/dL (ref 8.7–10.2)
Chloride: 103 mmol/L (ref 96–106)
Creatinine, Ser: 0.61 mg/dL (ref 0.57–1.00)
GFR calc Af Amer: 141 mL/min/{1.73_m2} (ref >59)
GFR calc non Af Amer: 122 mL/min/{1.73_m2} (ref >59)
Globulin, Total: 2.6 g/dL (ref 1.5–4.5)
Glucose: 85 mg/dL (ref 65–99)
Potassium: 4.3 mmol/L (ref 3.5–5.2)
Sodium: 137 mmol/L (ref 134–144)
Total Protein: 7.1 g/dL (ref 6.0–8.5)

## 2018-11-19 ENCOUNTER — Encounter: Payer: Self-pay | Admitting: Emergency Medicine

## 2018-11-19 NOTE — Telephone Encounter (Signed)
Responded to her message regarding increased liver enzyme.

## 2018-11-24 NOTE — Telephone Encounter (Signed)
Already spoke to her. Thanks

## 2019-01-01 ENCOUNTER — Encounter: Payer: Self-pay | Admitting: *Deleted

## 2019-01-28 NOTE — Progress Notes (Signed)
Cardiology Office Note:   Date:  01/29/2019  NAME:  Tiffany Guerrero    MRN: [id number] DOB:  July 31, 1985   PCP:  Forrest Moron, MD  Cardiologist:  Evalina Field, MD  Electrophysiologist:  None   Referring MD: Horald Pollen, *   Chief Complaint  Patient presents with  . Heart Murmur   History of Present Illness:   Tiffany Guerrero is a 33 y.o. female with a hx of vertigo who is being seen today for the evaluation of cardiac murmur at the request of Delia Chimes A, MD.  She presents for evaluation of cardiac murmur.  She reports her primary care physician noticed a murmur on her recent examination and referred her here for management.  She reports no significant medical history.  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  She reports she is seeing physicians for abdominal pain and nonspecific symptoms that have all been rather benign in etiology.  She has no history of high blood pressure or diabetes.  Lab work from 2 years ago in this young patient is very normal.  She reports no real symptoms of exercise intolerance, or chest pain or trouble breathing.  She is able to do up to 30 minutes of yard work or exercise without limitations.  She is a never smoker and does not use alcohol or drugs.  She is relatively healthy.  Past Medical History: History reviewed. No pertinent past medical history.  Past Surgical History: Past Surgical History:  Procedure Laterality Date  . WISDOM TOOTH EXTRACTION      Current Medications: No outpatient medications have been marked as taking for the 01/29/19 encounter (Office Visit) with Geralynn Rile, MD.     Allergies:    Patient has no known allergies.   Social History: Social History   Socioeconomic History  . Marital status: Married    Spouse name: Not on file  . Number of children: Not on file  . Years of education: Not on file   . Highest education level: Not on file  Occupational History  . Not on file  Social Needs  . Financial resource strain: Not on file  . Food insecurity    Worry: Not on file    Inability: Not on file  . Transportation needs    Medical: Not on file    Non-medical: Not on file  Tobacco Use  . Smoking status: Never Smoker  . Smokeless tobacco: Never Used  Substance and Sexual Activity  . Alcohol use: Yes    Alcohol/week: 1.0 standard drinks    Types: 1 Cans of beer per week    Comment: one beer per month  . Drug use: No  . Sexual activity: Yes  Lifestyle  . Physical activity    Days per week: Not on file    Minutes per session: Not on file  . Stress: Not on file  Relationships  . Social Herbalist on phone: Not on file    Gets together: Not on file    Attends religious service: Not on file    Active member of club or organization: Not on file    Attends meetings of clubs or organizations: Not on file    Relationship status: Not on file  Other Topics Concern  . 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  ROS:   All other ROS reviewed and negative. Pertinent positives noted in the HPI.     EKGs/Labs/Other Studies Reviewed:   The following studies were personally reviewed by me today:  EKG:  EKG is ordered today.  The ekg ordered today demonstrates Normal sinus rhythm, heart rate 83, normal intervals, no acute ST-T changes, no infarction, and was personally reviewed by me.   Recent Labs: 11/17/2018: ALT 38; BUN 12; Creatinine, Ser 0.61; Hemoglobin 13.3; Platelets 219; Potassium 4.3; Sodium 137   Recent Lipid Panel    Component Value Date/Time   CHOL 169 12/18/2016 1746   TRIG 89 12/18/2016 1746   HDL 61  12/18/2016 1746   CHOLHDL 2.8 12/18/2016 1746   LDLCALC 90 12/18/2016 1746    Physical Exam:   VS:  BP 114/78   Pulse 83   Ht 5\' 4"  (1.626 m)   Wt 159 lb 9.6 oz (72.4 kg)   BMI 27.40 kg/m    Wt Readings from Last 3 Encounters:  01/29/19 159 lb 9.6 oz (72.4 kg)  11/17/18 159 lb 12.8 oz (72.5 kg)  04/15/18 158 lb (71.7 kg)    General: Well nourished, well developed, in no acute distress Heart: Atraumatic, normal size  Eyes: PEERLA, EOMI  Neck: Supple, no JVD Endocrine: No thryomegaly Cardiac: Normal S1, S2; 3-6 systolic ejection murmur best heard at the left lower sternal border, does not radiate to the carotids, does not vary with respiration Lungs: Clear to auscultation bilaterally, no wheezing, rhonchi or rales  Abd: Soft, nontender, no hepatomegaly  Ext: No edema, pulses 2+ Musculoskeletal: No deformities, BUE and BLE strength normal and equal Skin: Warm and dry, no rashes   Neuro: Alert and oriented to person, place, time, and situation, CNII-XII grossly intact, no focal deficits  Psych: Normal mood and affect   ASSESSMENT:   NAME@ is a 33 y.o. female who presents for the following: 1. Murmur, cardiac     PLAN:   1. Murmur, cardiac -She has a systolic ejection murmur best heard at the left lower sternal border, does not radiate to the carotids, no variation with respiration, possibly suggestive of pulmonary stenosis.  She has no real cardiovascular history that I can tell.  Her EKG is normal.  We will proceed with an echocardiogram, and then I will see her back in 3 months.  I will plan to follow-up with her by phone regarding the results of her echocardiogram and we can plan earlier follow-up if needed.   Disposition: Return in about 3 months (around 04/30/2019).  Medication Adjustments/Labs and Tests Ordered: Current medicines are reviewed at length with the patient today.  Concerns regarding medicines are outlined above.  Orders Placed This Encounter  Procedures   . EKG 12-Lead  . ECHOCARDIOGRAM COMPLETE   No orders of the defined types were placed in this encounter.   Patient Instructions  Medication Instructions:  Continue current medications  If you need a refill on your cardiac medications before your next appointment, please call your pharmacy.  Labwork: None Ordered   Testing/Procedures: Your physician has requested that you have an echocardiogram. Echocardiography is a painless test that uses sound waves to create images of your heart. It provides your doctor with information about the size and shape of your heart and how well your heart's chambers  and valves are working. This procedure takes approximately one hour. There are no restrictions for this procedure.   Follow-Up: You will need a follow up appointment in 3 months.  Please call our office 2 months in advance to schedule this appointment.  You may see Reatha Harps, MD or one of the following Advanced Practice Providers on your designated Care Team:   Theodore Demark, PA-C . Joni Reining, DNP, ANP     At [facility], you and your health needs are our priority.  As part of our continuing mission to provide you with exceptional heart care, we have created designated Provider Care Teams.  These Care Teams include your primary Cardiologist (physician) and Advanced Practice Providers (APPs -  Physician Assistants and Nurse Practitioners) who all work together to provide you with the care you need, when you need it.  Thank you for choosing [facility] at [facility] T. Flora Lipps, MD [facility]  [address], [address] [place], [place] [address] [phone]  01/29/2019 4:22 PM

## 2019-01-29 ENCOUNTER — Other Ambulatory Visit: Payer: Self-pay

## 2019-01-29 ENCOUNTER — Encounter: Payer: Self-pay | Admitting: Cardiovascular Disease

## 2019-01-29 ENCOUNTER — Ambulatory Visit: Payer: 59 | Admitting: Cardiovascular Disease

## 2019-01-29 VITALS — BP 114/78 | HR 83 | Ht 64.0 in | Wt 159.6 lb

## 2019-01-29 DIAGNOSIS — R011 Cardiac murmur, unspecified: Secondary | ICD-10-CM | POA: Diagnosis not present

## 2019-01-29 NOTE — Patient Instructions (Addendum)
Medication Instructions:  Continue current medications  If you need a refill on your cardiac medications before your next appointment, please call your pharmacy.  Labwork: None Ordered   Testing/Procedures: Your physician has requested that you have an echocardiogram. Echocardiography is a painless test that uses sound waves to create images of your heart. It provides your doctor with information about the size and shape of your heart and how well your heart's chambers and valves are working. This procedure takes approximately one hour. There are no restrictions for this procedure.   Follow-Up: You will need a follow up appointment in 3 months.  Please call our office 2 months in advance to schedule this appointment.  You may see Evalina Field, MD or one of the following Advanced Practice Providers on your designated Care Team:   Rosaria Ferries, PA-C . Jory Sims, DNP, ANP     At [facility], you and your health needs are our priority.  As part of our continuing mission to provide you with exceptional heart care, we have created designated Provider Care Teams.  These Care Teams include your primary Cardiologist (physician) and Advanced Practice Providers (APPs -  Physician Assistants and Nurse Practitioners) who all work together to provide you with the care you need, when you need it.  Thank you for choosing [facility] at [facility]!!

## 2019-02-04 ENCOUNTER — Ambulatory Visit (HOSPITAL_COMMUNITY): Payer: 59 | Attending: Cardiovascular Disease

## 2019-02-04 ENCOUNTER — Other Ambulatory Visit: Payer: Self-pay

## 2019-02-04 DIAGNOSIS — R011 Cardiac murmur, unspecified: Secondary | ICD-10-CM

## 2019-02-04 MED ORDER — PERFLUTREN LIPID MICROSPHERE
1.0000 mL | INTRAVENOUS | Status: AC | PRN
  Administered 2019-02-04: 2 mL via INTRAVENOUS

## 2019-04-29 NOTE — Progress Notes (Signed)
Cardiology Office Note:   Date:  04/30/2019  NAME:  Jeffie Widdowson    MRN: [id number] DOB:  Mar 15, 1986   PCP:  Doristine Bosworth, MD  Cardiologist:  Reatha Harps, MD   Referring MD: Doristine Bosworth, MD   Chief Complaint  Patient presents with  . Heart Murmur   History of Present Illness:   Charity Tessier is a 33 y.o. female with history of vertigo who presents for follow-up of cardiac murmur.  Her echocardiogram was really unremarkable.  It was read as ejection fraction 45 to 50%, but she had frequent PVCs during examination.  On my review the echocardiogram was very normal.  Her diastolic function was supranormal, which also goes against any evidence of cardiomyopathy.  She presents without complaints.  She reports she is no major complaints today.  She is without any limitations.  She does not routinely exercise and has no major limitations.  She has no evidence of congestive heart failure examination.  Her murmur is there however it is faint.  She is relieved that her heart ultrasound was normal and she has no significant valvular heart disease.  Past Medical History: History reviewed. No pertinent past medical history.  Past Surgical History: Past Surgical History:  Procedure Laterality Date  . WISDOM TOOTH EXTRACTION      Current Medications: No outpatient medications have been marked as taking for the 04/30/19 encounter (Office Visit) with Sande Rives, MD.     Allergies:    Patient has no known allergies.   Social History: Social History   Socioeconomic History  . Marital status: Married    Spouse name: Not on file  . Number of children: Not on file  . Years of education: Not on file  . Highest education level: Not on file  Occupational History  . Not on file  Tobacco Use  . Smoking status: Never Smoker  . Smokeless tobacco: Never Used  Substance and Sexual Activity  . Alcohol use: Yes    Alcohol/week: 1.0 standard drinks    Types:  1 Cans of beer per week    Comment: one beer per month  . Drug use: No  . Sexual activity: Yes  Other Topics Concern  . 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 Difficulty of Paying Living Expenses: Not on file  Food Insecurity:   . Worried About Programme researcher, broadcasting/film/video in the Last Year: Not on file  . Ran Out of Food in the Last Year: Not on file  Transportation Needs:   . Lack of Transportation (Medical): Not on file  . Lack of Transportation (Non-Medical): Not on file  Physical Activity:   . Days of Exercise per Week: Not on file  . Minutes of Exercise per Session: Not on file  Stress:   . Feeling of Stress : Not on file  Social Connections:   . Frequency of Communication with Friends and Family: Not on file  . Frequency of Social Gatherings with Friends and Family: Not on file  . Attends Religious Services: Not on file  . Active Member of Clubs or Organizations: Not on file  . Attends Banker Meetings: Not on file  . Marital Status: Not on file     Family History: The patient's family history includes Alzheimer's disease in her paternal grandfather; Cancer in her paternal grandfather; Diabetes in  her brother and father; Idiopathic pulmonary fibrosis in her mother.  ROS:   All other ROS reviewed and negative. Pertinent positives noted in the HPI.     EKGs/Labs/Other Studies Reviewed:   The following studies were personally reviewed by me today:  TTE 02/04/2019  1. Left ventricular ejection fraction, by visual estimation, is 45 to 50%. The left ventricle has normal function. Left ventricular septal wall thickness was normal. Normal left ventricular posterior wall thickness. There is no left ventricular  hypertrophy.  2. The apex is moderately hypokinetic.  3. Global right ventricle has normal systolic function.The right ventricular size is normal. No increase in  right ventricular wall thickness.  4. Left atrial size was normal.  5. Right atrial size was normal.  6. The mitral valve is normal in structure. No evidence of mitral valve regurgitation.  7. The tricuspid valve is normal in structure. Tricuspid valve regurgitation was not visualized by color flow Doppler.  8. The aortic valve The aortic valve is normal in structure. Aortic valve regurgitation was not visualized by color flow Doppler.  9. The pulmonic valve was normal in structure. Pulmonic valve regurgitation is not visualized by color flow Doppler. 10. The atrial septum is grossly normal.  Recent Labs: 11/17/2018: ALT 38; BUN 12; Creatinine, Ser 0.61; Hemoglobin 13.3; Platelets 219; Potassium 4.3; Sodium 137   Recent Lipid Panel    Component Value Date/Time   CHOL 169 12/18/2016 1746   TRIG 89 12/18/2016 1746   HDL 61 12/18/2016 1746   CHOLHDL 2.8 12/18/2016 1746   LDLCALC 90 12/18/2016 1746    Physical Exam:   VS:  BP 137/78   Pulse 96   Ht 5\' 3"  (1.6 m)   Wt 162 lb 3.2 oz (73.6 kg)   SpO2 97%   BMI 28.73 kg/m    Wt Readings from Last 3 Encounters:  04/30/19 162 lb 3.2 oz (73.6 kg)  01/29/19 159 lb 9.6 oz (72.4 kg)  11/17/18 159 lb 12.8 oz (72.5 kg)    General: Well nourished, well developed, in no acute distress Heart: Atraumatic, normal size  Eyes: PEERLA, EOMI  Neck: Supple, no JVD Endocrine: No thryomegaly Cardiac: 2 out of 6 systolic ejection murmur Lungs: Clear to auscultation bilaterally, no wheezing, rhonchi or rales  Abd: Soft, nontender, no hepatomegaly  Ext: No edema, pulses 2+ Musculoskeletal: No deformities, BUE and BLE strength normal and equal Skin: Warm and dry, no rashes   Neuro: Alert and oriented to person, place, time, and situation, CNII-XII grossly intact, no focal deficits  Psych: Normal mood and affect   ASSESSMENT:   Milagros EvenerMarina Rincon-Bermudez is a 33 y.o. female who presents for the following: 1. Murmur   2. PVC (premature ventricular  contraction)     PLAN:   1. Murmur 2. PVC (premature ventricular contraction) -She has a 2 out of 6 systolic ejection murmur.  Echocardiogram was very normal.  No significant valvular heart disease.  She was noted to have PVCs during her echocardiogram and her EF was read around 50%.  She has supranormal diastolic function and no evidence of any cardiomyopathy.  Given her stability and lack of symptoms and no significant valvular heart disease we will continue with conservative management at this time.  We will monitor her symptoms.  We will see her on annual basis.   Disposition: Return in about 1 year (around 04/29/2020).  Medication Adjustments/Labs and Tests Ordered: Current medicines are reviewed at length with the patient today.  Concerns regarding medicines  are outlined above.  No orders of the defined types were placed in this encounter.  No orders of the defined types were placed in this encounter.   Patient Instructions  Your physician recommends that you continue on your current medications as directed. Please refer to the Current Medication list given to you today.  Your physician wants you to follow-up in: [place] will receive a reminder letter in the mail two months in advance. If you don't receive a letter, please call our office to schedule the follow-up appointment.     Signed, Addison Naegeli. Audie Box, [place]  [address], [place], [place] [address] [phone]  04/30/2019 5:12 PM

## 2019-04-30 ENCOUNTER — Other Ambulatory Visit: Payer: Self-pay

## 2019-04-30 ENCOUNTER — Ambulatory Visit: Payer: 59 | Admitting: Cardiovascular Disease

## 2019-04-30 ENCOUNTER — Encounter: Payer: Self-pay | Admitting: Cardiovascular Disease

## 2019-04-30 VITALS — BP 137/78 | HR 96 | Ht 63.0 in | Wt 162.2 lb

## 2019-04-30 DIAGNOSIS — R011 Cardiac murmur, unspecified: Secondary | ICD-10-CM

## 2019-04-30 DIAGNOSIS — I493 Ventricular premature depolarization: Secondary | ICD-10-CM | POA: Diagnosis not present

## 2019-04-30 NOTE — Patient Instructions (Signed)
Your physician recommends that you continue on your current medications as directed. Please refer to the Current Medication list given to you today.  Your physician wants you to follow-up in: [place] will receive a reminder letter in the mail two months in advance. If you don't receive a letter, please call our office to schedule the follow-up appointment.

## 2019-07-24 ENCOUNTER — Ambulatory Visit: Payer: 59 | Attending: Internal Medicine

## 2019-07-24 ENCOUNTER — Other Ambulatory Visit: Payer: Self-pay

## 2019-07-24 DIAGNOSIS — Z23 Encounter for immunization: Secondary | ICD-10-CM | POA: Insufficient documentation

## 2019-07-24 NOTE — Progress Notes (Signed)
   Covid-19 Vaccination Clinic  Name:  Tiffany Guerrero    MRN: [id number] DOB: 1985-12-12  07/24/2019  Tiffany Guerrero was observed post Covid-19 immunization for 15 minutes without incident. She was provided with Vaccine Information Sheet and instruction to access the V-Safe system.   Tiffany Guerrero was instructed to call 911 with any severe reactions post vaccine: Marland Kitchen Difficulty breathing  . Swelling of face and throat  . A fast heartbeat  . A bad rash all over body  . Dizziness and weakness   Immunizations Administered    Name Date Dose VIS Date Route   Moderna COVID-19 Vaccine 07/24/2019  2:57 PM 0.5 mL 04/20/2019 Intramuscular   Manufacturer: Moderna   Lot: [id number]   NDC: 76160-737-10

## 2019-07-29 ENCOUNTER — Ambulatory Visit: Payer: 59

## 2019-08-21 ENCOUNTER — Ambulatory Visit: Payer: 59

## 2019-08-21 ENCOUNTER — Ambulatory Visit: Payer: 59 | Attending: Internal Medicine

## 2019-08-21 DIAGNOSIS — Z23 Encounter for immunization: Secondary | ICD-10-CM

## 2019-08-21 NOTE — Progress Notes (Signed)
   Covid-19 Vaccination Clinic  Name:  Tiffany Guerrero    MRN: [id number] DOB: 07/26/1985  08/21/2019  Tiffany Guerrero was observed post Covid-19 immunization for 15 minutes without incident. She was provided with Vaccine Information Sheet and instruction to access the V-Safe system.   Tiffany Guerrero was instructed to call 911 with any severe reactions post vaccine: Marland Kitchen Difficulty breathing  . Swelling of face and throat  . A fast heartbeat  . A bad rash all over body  . Dizziness and weakness   Immunizations Administered    Name Date Dose VIS Date Route   Moderna COVID-19 Vaccine 08/21/2019  2:14 PM 0.5 mL 04/20/2019 Intramuscular   Manufacturer: Gala Murdoch   Lot: [id number]   NDC: 99967-227-73

## 2019-10-26 ENCOUNTER — Encounter: Payer: Self-pay | Admitting: Emergency Medicine

## 2019-10-26 ENCOUNTER — Other Ambulatory Visit: Payer: Self-pay

## 2019-10-26 ENCOUNTER — Ambulatory Visit: Payer: 59 | Admitting: Emergency Medicine

## 2019-10-26 VITALS — BP 127/87 | HR 114 | Temp 97.9°F | Resp 16 | Ht 63.0 in | Wt 163.0 lb

## 2019-10-26 DIAGNOSIS — R350 Frequency of micturition: Secondary | ICD-10-CM | POA: Diagnosis not present

## 2019-10-26 DIAGNOSIS — R8290 Unspecified abnormal findings in urine: Secondary | ICD-10-CM

## 2019-10-26 LAB — POCT URINALYSIS DIP (MANUAL ENTRY)
Bilirubin, UA: NEGATIVE
Glucose, UA: NEGATIVE mg/dL
Ketones, POC UA: NEGATIVE mg/dL
Leukocytes, UA: NEGATIVE
Nitrite, UA: NEGATIVE
Protein Ur, POC: 30 mg/dL — AB
Spec Grav, UA: 1.02 (ref 1.010–1.025)
Urobilinogen, UA: 0.2 E.U./dL
pH, UA: 7 (ref 5.0–8.0)

## 2019-10-26 LAB — POCT URINE PREGNANCY: Preg Test, Ur: NEGATIVE

## 2019-10-26 LAB — POC MICROSCOPIC URINALYSIS (UMFC): Mucus: ABSENT

## 2019-10-26 MED ORDER — SULFAMETHOXAZOLE-TRIMETHOPRIM 800-160 MG PO TABS: 1.0000 | ORAL_TABLET | Freq: Two times a day (BID) | ORAL | 0 refills | Status: AC

## 2019-10-26 NOTE — Progress Notes (Signed)
Tiffany Guerrero 34 y.o.   Chief Complaint  Patient presents with  . Urinary Frequency    noticed odor to the urine and burning with urination 1 1/2 weeks    HISTORY OF PRESENT ILLNESS: This is a 34 y.o. female complaining of urinary frequency and malodorous urine for the past week.  Some burning at the end of urination.  Denies flank pain.  Denies fever or chills.  Denies nausea or vomiting. No other significant associated symptoms.  HPI   Prior to Admission medications   Not on File    No Known Allergies  Patient Active Problem List   Diagnosis Date Noted  . Heart murmur 11/17/2018    No past medical history on file.  Past Surgical History:  Procedure Laterality Date  . WISDOM TOOTH EXTRACTION      Social History   Socioeconomic History  . Marital status: Married    Spouse name: Not on file  . Number of children: Not on file  . Years of education: Not on file  . Highest education level: Not on file  Occupational History  . Not on file  Tobacco Use  . Smoking status: Never Smoker  . Smokeless tobacco: Never Used  Substance and Sexual Activity  . Alcohol use: Yes    Alcohol/week: 1.0 standard drinks    Types: 1 Cans of beer per week    Comment: one beer per month  . Drug use: No  . Sexual activity: Yes  Other Topics Concern  . Not on file  Social History Narrative   Art gallery manager with [employer]    Social Determinants of Health   Financial Resource Strain:   . Difficulty of Paying Living Expenses:   Food Insecurity:   . Worried About Programme researcher, broadcasting/film/video in the Last Year:   . Barista in the Last Year:   Transportation Needs:   . Freight forwarder (Medical):   Marland Kitchen Lack of Transportation (Non-Medical):   Physical Activity:   . Days of Exercise per Week:   . Minutes of Exercise per Session:   Stress:   . Feeling of Stress :   Social Connections:   . Frequency of Communication with Friends and Family:   . Frequency of Social  Gatherings with Friends and Family:   . Attends Religious Services:   . Active Member of Clubs or Organizations:   . Attends Banker Meetings:   Marland Kitchen Marital Status:   Intimate Partner Violence:   . Fear of Current or Ex-Partner:   . Emotionally Abused:   Marland Kitchen Physically Abused:   . Sexually Abused:     Family History  Problem Relation Age of Onset  . Idiopathic pulmonary fibrosis Mother   . Diabetes Father   . Diabetes Brother   . Cancer Paternal Grandfather   . Alzheimer's disease Paternal Grandfather      Review of Systems  Constitutional: Negative.  Negative for chills and fever.  HENT: Negative.  Negative for congestion and sore throat.   Respiratory: Negative.  Negative for cough and shortness of breath.   Cardiovascular: Negative.  Negative for chest pain and palpitations.  Gastrointestinal: Negative.  Negative for abdominal pain, diarrhea, nausea and vomiting.  Genitourinary: Positive for dysuria, frequency and urgency. Negative for flank pain and hematuria.  Musculoskeletal: Negative.   Skin: Negative.  Negative for rash.  Neurological: Negative.  Negative for dizziness and headaches.  Endo/Heme/Allergies: Negative.   All other systems reviewed and are  negative.  Today's Vitals   10/26/19 1623  BP: 127/87  Pulse: (!) 114  Resp: 16  Temp: 97.9 F (36.6 C)  TempSrc: Temporal  SpO2: 96%  Weight: 163 lb (73.9 kg)  Height: 5\' 3"  (1.6 m)   Body mass index is 28.87 kg/m.   Physical Exam Vitals reviewed.  Constitutional:      Appearance: Normal appearance.  HENT:     Head: Normocephalic.     Mouth/Throat:     Mouth: Mucous membranes are moist.     Pharynx: Oropharynx is clear.  Eyes:     Extraocular Movements: Extraocular movements intact.     Conjunctiva/sclera: Conjunctivae normal.     Pupils: Pupils are equal, round, and reactive to light.  Cardiovascular:     Rate and Rhythm: Normal rate and regular rhythm.     Pulses: Normal pulses.      Heart sounds: Normal heart sounds.  Pulmonary:     Effort: Pulmonary effort is normal.     Breath sounds: Normal breath sounds.  Abdominal:     General: There is no distension.     Palpations: Abdomen is soft.     Tenderness: There is no abdominal tenderness. There is no right CVA tenderness or left CVA tenderness.  Musculoskeletal:        General: Normal range of motion.     Cervical back: Normal range of motion and neck supple.  Skin:    General: Skin is warm and dry.     Capillary Refill: Capillary refill takes less than 2 seconds.  Neurological:     General: No focal deficit present.     Mental Status: She is alert and oriented to person, place, and time.  Psychiatric:        Mood and Affect: Mood normal.        Behavior: Behavior normal.    Results for orders placed or performed in visit on 10/26/19 (from the past 24 hour(s))  POCT urinalysis dipstick     Status: Abnormal   Collection Time: 10/26/19  4:40 PM  Result Value Ref Range   Color, UA yellow yellow   Clarity, UA clear clear   Glucose, UA negative negative mg/dL   Bilirubin, UA negative negative   Ketones, POC UA negative negative mg/dL   Spec Grav, UA 12/26/19 2.355 - 1.025   Blood, UA large (A) negative   pH, UA 7.0 5.0 - 8.0   Protein Ur, POC =30 (A) negative mg/dL   Urobilinogen, UA 0.2 0.2 or 1.0 E.U./dL   Nitrite, UA Negative Negative   Leukocytes, UA Negative Negative  POCT urine pregnancy     Status: None   Collection Time: 10/26/19  4:44 PM  Result Value Ref Range   Preg Test, Ur Negative Negative  POCT Microscopic Urinalysis ([facility])     Status: Abnormal   Collection Time: 10/26/19  4:50 PM  Result Value Ref Range   WBC,UR,HPF,POC None None WBC/hpf   RBC,UR,HPF,POC Moderate (A) None RBC/hpf   Bacteria None None, Too numerous to count   Mucus Absent Absent   Epithelial Cells, UR Per Microscopy Few (A) None, Too numerous to count cells/hpf    A total of 30 minutes was spent with the patient, greater  than 50% of which was in counseling/coordination of care regarding differential diagnosis of urinary symptoms, treatment of possible UTI, review of most recent office visit notes, review of most recent blood work results including today's urinalysis, establishing care and review of past  medical history, diet and nutrition, review of all medications including antibiotic Bactrim DS, ED precautions, prognosis and need for follow-up if no better or worse during the next several days..  ASSESSMENT & PLAN: Delita was seen today for urinary frequency.  Diagnoses and all orders for this visit:  Urinary frequency Comments: Suspected UTI Orders: -     POCT Microscopic Urinalysis ([facility]) -     POCT urinalysis dipstick -     POCT urine pregnancy -     Urine Culture -     sulfamethoxazole-trimethoprim (BACTRIM DS) 800-160 MG tablet; Take 1 tablet by mouth 2 (two) times daily for 7 days.  Malodorous urine -     sulfamethoxazole-trimethoprim (BACTRIM DS) 800-160 MG tablet; Take 1 tablet by mouth 2 (two) times daily for 7 days.    Patient Instructions       If you have lab work done today you will be contacted with your lab results within the next 2 weeks.  If you have not heard from [place] then please contact us. The fastest way to get your results is to register for My Chart.   IF you received an x-ray today, you will receive an invoice from [facility]. Please contact [facility] at [phone] with questions or concerns regarding your invoice.   IF you received labwork today, you will receive an invoice from [place]. Please contact [facility] at [phone] with questions or concerns regarding your invoice.   Our billing staff will not be able to assist you with questions regarding bills from these companies.  You will be contacted with the lab results as soon as they are available. The fastest way to get your results is to activate your My Chart account. Instructions are  located on the last page of this paperwork. If you have not heard from [place] regarding the results in 2 weeks, please contact this office.     Infeccin urinaria en los adultos Urinary Tract Infection, Adult Una infeccin urinaria (IU) puede ocurrir en Corporate treasurer de las vas [place]. Las vas urinarias incluyen lo siguiente:  Los riones.  Los urteres.  La vejiga.  La uretra. Estos rganos fabrican, almacenan y eliminan el pis (orina) del cuerpo. Cules son las causas? La causa es la presencia de grmenes (bacterias) en la zona genital. Estos grmenes proliferan y causan hinchazn (inflamacin) de las vas urinarias. Qu incrementa el riesgo? Es ms probable que contraiga esta afeccin si:  Tiene colocado un tubo delgado y pequeo (catter) para drenar el pis.  Nopuede controlar la evacuacin de pis ni de materia fecal (incontinencia).  Es Nurse, learning disability y, adems: ? [place] estos mtodos para Location manager:  Un medicamento que [employer] espermatozoides (espermicida).  Un dispositivo que impide el paso de los espermatozoides (diafragma). ? Tieneniveles bajos de una hormona femenina (estrgeno). ? Est embarazada.  Tiene genes que [employer].  Es sexualmente activa.  Toma antibiticos.  Tiene dificultad para orinar debido a: ? Su prstata es ms grande de lo normal, si usted es hombre. ? Obstruccin en la parte del cuerpo que drena el pis de la vejiga (uretra). ? Clculo renal. ? Untrastorno nervioso que afecta la vejiga (vejiga neurgena). ? No bebe una cantidad suficiente de lquido. ? No hace pis con la frecuencia suficiente.  Tiene otras afecciones, como: ? Diabetes. ? Un sistema que combate las enfermedades (sistema inmunitario) debilitado. ? Anemia drepanoctica. ? Gota. ? Lesin en la columna vertebral. Cules son los signos o los sntomas? Los sntomas  de esta afeccin incluyen:  Necesidad inmediata (urgente) de hacer pis.  Hacer pis con  frecuencia.  Hacer poca cantidad de pis con mucha frecuencia.  Dolor o ardor al [employer].  Sangre en el pis.  Pis que huele mal o anormal.  Dificultad para hacer pis.  Pis turbio.  Lquido que sale de la vagina, si es Carlos.  Dolor en la barriga o en la parte baja de la espalda. Otros sntomas pueden incluir los siguientes:  Vmitos.  No sentir deseos de comer.  Sentirse confundido (confuso).  Sentirse cansado y malhumorado (irritable).  Cristy Hilts.  Materia fecal lquida (diarrea). Cmo se trata? El tratamiento de esta afeccin puede incluir:  Antibiticos.  Otros medicamentos.  Beber una cantidad suficiente de agua. Sigue estas instrucciones en tu casa:  Medicamentos  [employer] de venta libre y los recetados solamente como se lo haya indicado el mdico.  Si le recetaron un antibitico, tmelo como se lo haya indicado el mdico. No deje de tomarlo aunque comience a sentirse mejor. Indicaciones generales  Asegrese de hacer lo siguiente: ? Haga pis hasta que la vejiga quede vaca. ? Nocontenga el pis durante mucho tiempo. ? Vace la vejiga despus de Clinical biochemist. ? Lmpiese de adelante hacia atrs despus de defecar, si es mujer. Use cada trozo de papel higinico solo una vez cuando se limpie.  Beba suficiente lquido como para Theatre manager la orina de color amarillo plido.  Concurra a todas las visitas de seguimiento como se lo haya indicado el mdico. Esto es importante. Comunquese con un mdico si:  No mejora despus de 1 o 2das de tratamiento.  Los sntomas desaparecen y Teacher, adult education. Solicite ayuda inmediatamente si:  Tiene un dolor muy intenso en la espalda.  Tiene dolor muy intenso en la parte baja de la barriga.  Tener fiebre.  Tiene Higher education careers adviser (nuseas).  Tiene vmitos. Resumen  Una infeccin urinaria (IU) puede ocurrir en Clinical cytogeneticist de las vas [employer].  Esta afeccin es causada por la  presencia de grmenes en la zona genital.  Existen muchos factores de riesgo de sufrir una IU. Estos incluyen tener colocado un tubo delgado y pequeo para drenar el pis y no poder controlar cundo hace pis y materia fecal.  El tratamiento incluye antibiticos contra los grmenes.  Beba suficiente lquido como para Theatre manager la orina de color amarillo plido. Esta informacin no tiene Marine scientist el consejo del mdico. Asegrese de hacerle al mdico cualquier pregunta que tenga. Document Revised: 04/29/2018 Document Reviewed: 04/29/2018 Elsevier Patient Education  2020 Elsevier Inc.      Agustina Caroli, MD [facility]

## 2019-10-26 NOTE — Patient Instructions (Addendum)
   If you have lab work done today you will be contacted with your lab results within the next 2 weeks.  If you have not heard from us then please contact us. The fastest way to get your results is to register for My Chart.   IF you received an x-ray today, you will receive an invoice from [facility]. Please contact [facility] at [phone] with questions or concerns regarding your invoice.   IF you received labwork today, you will receive an invoice from [facility]. Please contact [facility] at [phone] with questions or concerns regarding your invoice.   Our billing staff will not be able to assist you with questions regarding bills from these companies.  You will be contacted with the lab results as soon as they are available. The fastest way to get your results is to activate your My Chart account. Instructions are located on the last page of this paperwork. If you have not heard from us regarding the results in 2 weeks, please contact this office.      Infeccin urinaria en los adultos Urinary Tract Infection, Adult Una infeccin urinaria (IU) puede ocurrir en cualquier lugar de las vas urinarias. Las vas urinarias incluyen lo siguiente:  Los riones.  Los urteres.  La vejiga.  La uretra. Estos rganos fabrican, almacenan y eliminan el pis (orina) del cuerpo. Cules son las causas? La causa es la presencia de grmenes (bacterias) en la zona genital. Estos grmenes proliferan y causan hinchazn (inflamacin) de las vas urinarias. Qu incrementa el riesgo? Es ms probable que contraiga esta afeccin si:  Tiene colocado un tubo delgado y pequeo (catter) para drenar el pis.  Nopuede controlar la evacuacin de pis ni de materia fecal (incontinencia).  Es mujer y, adems: ? [place] estos mtodos para evitar el embarazo:  Un medicamento que mata los espermatozoides (espermicida).  Un dispositivo que impide el paso de los espermatozoides  (diafragma). ? Tieneniveles bajos de una hormona femenina (estrgeno). ? Est embarazada.  Tiene genes que aumentan su riesgo.  Es sexualmente activa.  Toma antibiticos.  Tiene dificultad para orinar debido a: ? Su prstata es ms grande de lo normal, si usted es hombre. ? Obstruccin en la parte del cuerpo que drena el pis de la vejiga (uretra). ? Clculo renal. ? Untrastorno nervioso que afecta la vejiga (vejiga neurgena). ? No bebe una cantidad suficiente de lquido. ? No hace pis con la frecuencia suficiente.  Tiene otras afecciones, como: ? Diabetes. ? Un sistema que combate las enfermedades (sistema inmunitario) debilitado. ? Anemia drepanoctica. ? Gota. ? Lesin en la columna vertebral. Cules son los signos o los sntomas? Los sntomas de esta afeccin incluyen:  Necesidad inmediata (urgente) de hacer pis.  Hacer pis con frecuencia.  Hacer poca cantidad de pis con mucha frecuencia.  Dolor o ardor al hacer pis.  Sangre en el pis.  Pis que huele mal o anormal.  Dificultad para hacer pis.  Pis turbio.  Lquido que sale de la vagina, si es mujer.  Dolor en la barriga o en la parte baja de la espalda. Otros sntomas pueden incluir los siguientes:  Vmitos.  No sentir deseos de comer.  Sentirse confundido (confuso).  Sentirse cansado y malhumorado (irritable).  Fiebre.  Materia fecal lquida (diarrea). Cmo se trata? El tratamiento de esta afeccin puede incluir:  Antibiticos.  Otros medicamentos.  Beber una cantidad suficiente de agua. Sigue estas instrucciones en tu casa:  Medicamentos  T[place] los medicamentos de venta libre y   los recetados solamente como se lo haya indicado el mdico.  Si le recetaron un antibitico, tmelo como se lo haya indicado el mdico. No deje de tomarlo aunque comience a sentirse mejor. Indicaciones generales  Asegrese de hacer lo siguiente: ? Haga pis hasta que la vejiga quede vaca. ? Nocontenga el  pis durante mucho tiempo. ? Vace la vejiga despus de tener relaciones sexuales. ? Lmpiese de adelante hacia atrs despus de defecar, si es mujer. Use cada trozo de papel higinico solo una vez cuando se limpie.  Beba suficiente lquido como para mantener la orina de color amarillo plido.  Concurra a todas las visitas de seguimiento como se lo haya indicado el mdico. Esto es importante. Comunquese con un mdico si:  No mejora despus de 1 o 2das de tratamiento.  Los sntomas desaparecen y luego reaparecen. Solicite ayuda inmediatamente si:  Tiene un dolor muy intenso en la espalda.  Tiene dolor muy intenso en la parte baja de la barriga.  Tener fiebre.  Tiene malestar estomacal (nuseas).  Tiene vmitos. Resumen  Una infeccin urinaria (IU) puede ocurrir en cualquier lugar de las vas urinarias.  Esta afeccin es causada por la presencia de grmenes en la zona genital.  Existen muchos factores de riesgo de sufrir una IU. Estos incluyen tener colocado un tubo delgado y pequeo para drenar el pis y no poder controlar cundo hace pis y materia fecal.  El tratamiento incluye antibiticos contra los grmenes.  Beba suficiente lquido como para mantener la orina de color amarillo plido. Esta informacin no tiene como fin reemplazar el consejo del mdico. Asegrese de hacerle al mdico cualquier pregunta que tenga. Document Revised: 04/29/2018 Document Reviewed: 04/29/2018 Elsevier Patient Education  2020 Elsevier Inc.  

## 2019-10-28 LAB — URINE CULTURE

## 2020-05-07 NOTE — Progress Notes (Signed)
Cardiology Office Note:   Date:  05/08/2020  NAME:  Tiffany Guerrero    MRN: [id number] DOB:  Nov 26, 1985   PCP:  Georgina Quint, MD  Cardiologist:  Reatha Harps, MD   Referring MD: Georgina Quint, *   Chief Complaint  Patient presents with  . Follow-up        History of Present Illness:   Tiffany Guerrero is a 34 y.o. female with a hx of PVCs who presents for follow-up. Last year evaluated for murmur. Echo without valve problem. PVCs noted during study. EKG today is normal. No symptoms reported. She has no limitations but is not exercising. She reports she may have infrequent skipped beats but nothing is noticeable. She has no symptoms of CP, SOB, or palpitations.   Problem List 1. PVCs -EF ~50% -normal diastolic parameters -no symptoms   Past Medical History: History reviewed. No pertinent past medical history.  Past Surgical History: Past Surgical History:  Procedure Laterality Date  . WISDOM TOOTH EXTRACTION      Current Medications: No outpatient medications have been marked as taking for the 05/08/20 encounter (Office Visit) with Sande Rives, MD.     Allergies:    Patient has no known allergies.   Social History: Social History   Socioeconomic History  . Marital status: Married    Spouse name: Not on file  . Number of children: Not on file  . Years of education: Not on file  . Highest education level: Not on file  Occupational History  . Not on file  Tobacco Use  . Smoking status: Never Smoker  . Smokeless tobacco: Never Used  Substance and Sexual Activity  . Alcohol use: Yes    Alcohol/week: 1.0 standard drink    Types: 1 Cans of beer per week    Comment: one beer per month  . Drug use: No  . Sexual activity: Yes  Other Topics Concern  . 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  ROS:   All other ROS reviewed and negative. Pertinent positives noted in the HPI.     EKGs/Labs/Other Studies Reviewed:   The following studies were personally reviewed by me today:  EKG:  EKG is ordered today.  The ekg ordered today demonstrates NSR 74 bpm, no ischemic changes, no prior infarction, and was personally reviewed by me.   TTE 02/04/2019 1. Left ventricular ejection fraction, by visual estimation, is 45 to  50%. The left ventricle has normal function. Left ventricular septal wall  thickness was normal. Normal left ventricular posterior wall thickness.  There is no left ventricular  hypertrophy.  2. The apex is moderately hypokinetic.  3. Global right ventricle has normal systolic function.The right  ventricular size is normal. No increase in right ventricular wall  thickness.  4. Left atrial size was normal.  5. Right atrial size was normal.  6. The mitral valve is normal in structure. No evidence of mitral valve  regurgitation.  7. The tricuspid valve is  normal in structure. Tricuspid valve  regurgitation was not visualized by color flow Doppler.  8. The aortic valve The aortic valve is normal in structure. Aortic valve  regurgitation was not visualized by color flow Doppler.  9. The pulmonic valve was normal in structure. Pulmonic valve  regurgitation is not visualized by color flow Doppler.  10. The atrial septum is grossly normal.   Recent Labs: No results found for requested labs within last 8760 hours.   Recent Lipid Panel    Component Value Date/Time   CHOL 169 12/18/2016 1746   TRIG 89  12/18/2016 1746   HDL 61 12/18/2016 1746   CHOLHDL 2.8 12/18/2016 1746   LDLCALC 90 12/18/2016 1746    Physical Exam:   VS:  BP 120/60   Ht 5\' 4"  (1.626 m)   Wt 169 lb 6.4 oz (76.8 kg)   BMI 29.08 kg/m    Wt Readings from Last 3 Encounters:  05/08/20 169 lb 6.4 oz (76.8 kg)  10/26/19 163 lb (73.9 kg)  04/30/19 162 lb 3.2 oz (73.6 kg)    General: Well nourished, well developed, in no acute distress Head: Atraumatic, normal size  Eyes: PEERLA, EOMI  Neck: Supple, no JVD Endocrine: No thryomegaly Cardiac: Normal S1, S2; RRR; no murmurs, rubs, or gallops Lungs: Clear to auscultation bilaterally, no wheezing, rhonchi or rales  Abd: Soft, nontender, no hepatomegaly  Ext: No edema, pulses 2+ Musculoskeletal: No deformities, BUE and BLE strength normal and equal Skin: Warm and dry, no rashes   Neuro: Alert and oriented to person, place, time, and situation, CNII-XII grossly intact, no focal deficits  Psych: Normal mood and affect   ASSESSMENT:   Tiffany Guerrero is a 34 y.o. female who presents for the following: 1. PVC (premature ventricular contraction)     PLAN:   1. PVC (premature ventricular contraction) -no symptoms. EF ~50% on my review. She has supranormal diastolic parameters and no concerns for cardiomyopathy. Given stability and no symptoms she will see 34 as needed. She will return if she has any issues.   Disposition: Return if symptoms worsen or fail to improve.  Medication Adjustments/Labs and Tests Ordered: Current medicines are reviewed at length with the patient today.  Concerns regarding medicines are outlined above.  No orders of the defined types were placed in this encounter.  No orders of the defined types were placed in this encounter.   Patient Instructions  Medication Instructions:  The current medical regimen is effective;  continue present plan and medications.  *If you need a refill on your cardiac medications before your next  appointment, please call your pharmacy*   Follow-Up: At [facility], you and your health needs are our priority.  As part of our continuing mission to provide you with exceptional heart care, we have created designated Provider Care Teams.  These Care Teams include your primary Cardiologist (physician) and Advanced Practice Providers (APPs -  Physician Assistants and Nurse Practitioners) who all work together to provide you with the care you need, when you need it.  We recommend signing up for the patient portal called "MyChart".  Sign up information is provided on this After Visit Summary.  MyChart is used to connect with patients for Virtual Visits (Telemedicine).  Patients are able to view lab/test results, encounter notes, upcoming appointments, etc.  Non-urgent messages can be sent to your provider as well.   To learn more about what you can do with MyChart, go to [facility].    Your  next appointment:   As needed  The format for your next appointment:   In Person  Provider:   Lennie Odor, MD        Time Spent with Patient: I have spent a total of 25 minutes with patient reviewing hospital notes, telemetry, EKGs, labs and examining the patient as well as establishing an assessment and plan that was discussed with the patient.  > 50% of time was spent in direct patient care.  Signed, Lenna Gilford. Flora Lipps, MD [facility]  [address] [place], [place] [address] [phone]  05/08/2020 7:40 PM

## 2020-05-08 ENCOUNTER — Encounter: Payer: Self-pay | Admitting: Cardiovascular Disease

## 2020-05-08 ENCOUNTER — Other Ambulatory Visit: Payer: Self-pay

## 2020-05-08 ENCOUNTER — Ambulatory Visit: Payer: 59 | Admitting: Cardiovascular Disease

## 2020-05-08 VITALS — BP 120/60 | Ht 64.0 in | Wt 169.4 lb

## 2020-05-08 DIAGNOSIS — I493 Ventricular premature depolarization: Secondary | ICD-10-CM | POA: Diagnosis not present

## 2020-05-08 NOTE — Patient Instructions (Signed)
Medication Instructions:  The current medical regimen is effective;  continue present plan and medications.  *If you need a refill on your cardiac medications before your next appointment, please call your pharmacy*    Follow-Up: At [facility], you and your health needs are our priority.  As part of our continuing mission to provide you with exceptional heart care, we have created designated Provider Care Teams.  These Care Teams include your primary Cardiologist (physician) and Advanced Practice Providers (APPs -  Physician Assistants and Nurse Practitioners) who all work together to provide you with the care you need, when you need it.  We recommend signing up for the patient portal called "MyChart".  Sign up information is provided on this After Visit Summary.  MyChart is used to connect with patients for Virtual Visits (Telemedicine).  Patients are able to view lab/test results, encounter notes, upcoming appointments, etc.  Non-urgent messages can be sent to your provider as well.   To learn more about what you can do with MyChart, go to [email or web address].    Your next appointment:   As needed  The format for your next appointment:   In Person  Provider:   Ranlo O'Neal, MD      

## 2020-05-22 ENCOUNTER — Other Ambulatory Visit (INDEPENDENT_AMBULATORY_CARE_PROVIDER_SITE_OTHER): Payer: 59

## 2020-05-22 DIAGNOSIS — I493 Ventricular premature depolarization: Secondary | ICD-10-CM

## 2021-02-22 ENCOUNTER — Encounter: Payer: Self-pay | Admitting: Nurse Practitioner

## 2021-02-22 ENCOUNTER — Ambulatory Visit: Payer: 59 | Admitting: Nurse Practitioner

## 2021-02-22 ENCOUNTER — Other Ambulatory Visit: Payer: Self-pay

## 2021-02-22 VITALS — BP 128/66 | HR 76 | Temp 98.0°F | Ht 63.4 in | Wt 165.2 lb

## 2021-02-22 DIAGNOSIS — Z23 Encounter for immunization: Secondary | ICD-10-CM

## 2021-02-22 DIAGNOSIS — Z2821 Immunization not carried out because of patient refusal: Secondary | ICD-10-CM

## 2021-02-22 DIAGNOSIS — E663 Overweight: Secondary | ICD-10-CM | POA: Diagnosis not present

## 2021-02-22 DIAGNOSIS — R635 Abnormal weight gain: Secondary | ICD-10-CM

## 2021-02-22 DIAGNOSIS — N926 Irregular menstruation, unspecified: Secondary | ICD-10-CM | POA: Diagnosis not present

## 2021-02-22 DIAGNOSIS — Z1159 Encounter for screening for other viral diseases: Secondary | ICD-10-CM

## 2021-02-22 DIAGNOSIS — Z7689 Persons encountering health services in other specified circumstances: Secondary | ICD-10-CM

## 2021-02-22 DIAGNOSIS — Z0001 Encounter for general adult medical examination with abnormal findings: Secondary | ICD-10-CM | POA: Diagnosis not present

## 2021-02-22 DIAGNOSIS — Z114 Encounter for screening for human immunodeficiency virus [HIV]: Secondary | ICD-10-CM

## 2021-02-22 DIAGNOSIS — Z0000 Encounter for general adult medical examination without abnormal findings: Secondary | ICD-10-CM

## 2021-02-22 DIAGNOSIS — E559 Vitamin D deficiency, unspecified: Secondary | ICD-10-CM | POA: Diagnosis not present

## 2021-02-22 DIAGNOSIS — Z6828 Body mass index (BMI) 28.0-28.9, adult: Secondary | ICD-10-CM

## 2021-02-22 NOTE — Progress Notes (Signed)
I,Tianna Badgett,acting as a Education administrator for [employer], NP.,have documented all relevant documentation on the behalf of [employer], NP,as directed by  Bary Castilla, NP while in the presence of Bary Castilla, NP.  This visit occurred during the SARS-CoV-2 public health emergency.  Safety protocols were in place, including screening questions prior to the visit, additional usage of staff PPE, and extensive cleaning of exam room while observing appropriate contact time as indicated for disinfecting solutions.  Subjective:     Patient ID: Tiffany Guerrero , female    DOB: 1986/05/13 , 35 y.o.   MRN: [id number]   Chief Complaint  Patient presents with   Establish Care    HPI  Patient is here to establish care. Her wife is here to establish care as well. She works for the [employer] She does not have any children at this time but is looking forward to it one day in the future. She states that her cycles have always been irregular but she would like to look into this to see if this is why she is having a hard time losing weight and has extra hair on her face. LMP: 10/23/20.  Sexually active: yes. She does drink occasionally does not smoke.  Diet: Yogurt with fruit. Lunch she eats healthy. Dinner she eats  Exercise: She doesn't. She works in [employer].  Dentist and eye doctor every year.     No past medical history on file.   Family History  Problem Relation Age of Onset   Idiopathic pulmonary fibrosis Mother    Diabetes Father    Diabetes Brother    Cancer Paternal Grandfather    Alzheimer's disease Paternal Grandfather     No current outpatient medications on file.   No Known Allergies    The patient states she uses none for birth control. Last LMP was Patient's last menstrual period was 10/20/2020.. Negative for Dysmenorrhea. Negative for: breast discharge, breast lump(s), breast pain and breast self exam. Associated symptoms include abnormal  vaginal bleeding. Pertinent negatives include abnormal bleeding (hematology), anxiety, decreased libido, depression, difficulty falling sleep, dyspareunia, history of infertility, nocturia, sexual dysfunction, sleep disturbances, urinary incontinence, urinary urgency, vaginal discharge and vaginal itching. Diet regular.The patient states her exercise level is    . The patient's tobacco use is:  Social History   Tobacco Use  Smoking Status Never  Smokeless Tobacco Never  . She has been exposed to passive smoke. The patient's alcohol use is:  Social History   Substance and Sexual Activity  Alcohol Use Yes   Alcohol/week: 1.0 standard drink   Types: 1 Cans of beer per week   Comment: one beer per month  . Additional information: Last pap unkonwn, next one scheduled for 2022.    Review of Systems  Constitutional: Negative.  Negative for chills, fatigue and fever.  HENT: Negative.  Negative for congestion, rhinorrhea, sinus pressure, sinus pain and sneezing.   Eyes: Negative.   Respiratory: Negative.  Negative for cough, shortness of breath and wheezing.   Cardiovascular: Negative.  Negative for chest pain and palpitations.  Gastrointestinal: Negative.   Endocrine: Negative.  Negative for polydipsia, polyphagia and polyuria.  Genitourinary:  Positive for menstrual problem.  Musculoskeletal: Negative.  Negative for arthralgias and myalgias.  Skin: Negative.   Allergic/Immunologic: Negative.   Neurological: Negative.  Negative for weakness, numbness and headaches.  Hematological: Negative.   Psychiatric/Behavioral: Negative.  Negative for sleep disturbance.     Today's Vitals   02/22/21 1122  BP:  128/66  Pulse: 76  Temp: 98 F (36.7 C)  TempSrc: Oral  Weight: 165 lb 3.2 oz (74.9 kg)  Height: 5' 3.4" (1.61 m)   Body mass index is 28.9 kg/m.   Objective:  Physical Exam Vitals and nursing note reviewed.  Constitutional:      Appearance: Normal appearance.  HENT:     Head:  Normocephalic and atraumatic.     Right Ear: Tympanic membrane, ear canal and external ear normal. There is no impacted cerumen.     Left Ear: Tympanic membrane, ear canal and external ear normal. There is no impacted cerumen.     Nose: Nose normal. No congestion.     Mouth/Throat:     Mouth: Mucous membranes are moist.     Pharynx: Oropharynx is clear.  Eyes:     Extraocular Movements: Extraocular movements intact.     Conjunctiva/sclera: Conjunctivae normal.     Pupils: Pupils are equal, round, and reactive to light.  Cardiovascular:     Rate and Rhythm: Normal rate and regular rhythm.     Pulses: Normal pulses.     Heart sounds: Normal heart sounds.  Pulmonary:     Effort: Pulmonary effort is normal.     Breath sounds: Normal breath sounds.  Chest:  Breasts:    Tanner Score is 5.     Right: Normal. No swelling.     Left: Normal. No swelling.  Abdominal:     General: Abdomen is flat. Bowel sounds are normal.     Palpations: Abdomen is soft.  Genitourinary:    Comments: Deferred. Pt to see OBGYN  Musculoskeletal:        General: Normal range of motion.     Cervical back: Normal range of motion and neck supple.  Skin:    General: Skin is warm and dry.     Capillary Refill: Capillary refill takes less than 2 seconds.  Neurological:     General: No focal deficit present.     Mental Status: She is alert and oriented to person, place, and time.  Psychiatric:        Mood and Affect: Mood normal.        Behavior: Behavior normal.        Assessment And Plan:     1. Establishing care with new doctor, encounter for -Patient is here to establish care. Martin Majestic over patient medical, family, social and surgical history. -Reviewed with patient their medications and any allergies  -Reviewed with patient their sexual orientation, drug/tobacco and alcohol use -Dicussed any new concerns with patient  -recommended patient comes in for a physical exam and complete blood work.  -Educated  patient about the importance of annual screenings and immunizations.  -Advised patient to eat a healthy diet along with exercise for atleast 30-45 min atleast 4-5 days of the week.   2. Encounter for annual physical exam --Patient is here for their annual physical exam and we discussed any changes to medication and medical history.  -Behavior modification was discussed as well as diet and exercise history  -Patient will continue to exercise regularly and modify their diet.  -Recommendation for yearly physical annuals, immunization and screenings including mammogram and colonoscopy were discussed with the patient.  -Recommended intake of multivitamin, vitamin D and calcium.  -Individualized advise was given to the patient pertaining to their own health history in regards to diet, exercise, medical condition and referrals.  - CBC - Hemoglobin A1c - CMP14+EGFR - Lipid panel  3. Menstrual  periods irregular - Ambulatory referral to Obstetrics / Gynecology  4. Influenza vaccination declined -Education given to the pt about the importance of influenza vaccine.   5. Vitamin D deficiency -Will check and supplement if needed. Advised patient to spend atleast 15 min. Daily in sunlight.  - Vitamin D (25 hydroxy)  6. Encounter for screening for HIV - HIV Antibody (routine testing w rflx)  7. Encounter for hepatitis C screening test for low risk patient - Hepatitis C antibody  8. Weight gain - TSH + free T4  9. Need for Tdap vaccination - Tdap vaccine greater than or equal to 7yo IM  10. Overweight with body mass index (BMI) of 28 to 28.9 in adult -Advised patient on a healthy diet including avoiding fast food and red meats. Increase the intake of lean meats including grilled chicken and [place].  Drink a lot of water. Decrease intake of fatty foods. Exercise for 30-45 min. 4-5 a week to decrease the risk of cardiac event.    The patient was encouraged to call or send a message through  [facility] for any questions or concerns.   Follow up: if symptoms persist or do not get better.   Side effects and appropriate use of all the medication(s) were discussed with the patient today. Patient advised to use the medication(s) as directed by their healthcare provider. The patient was encouraged to read, review, and understand all associated package inserts and contact our office with any questions or concerns. The patient accepts the risks of the treatment plan and had an opportunity to ask questions.   Staying healthy and adopting a healthy lifestyle for your overall health is important. You should eat 7 or more servings of fruits and vegetables per day. You should drink plenty of water to keep yourself hydrated and your kidneys healthy. This includes about 65-80+ fluid ounces of water. Limit your intake of animal fats especially for elevated cholesterol. Avoid highly processed food and limit your salt intake if you have hypertension. Avoid foods high in saturated/Trans fats. Along with a healthy diet it is also very important to maintain time for yourself to maintain a healthy mental health with low stress levels. You should get atleast 150 min of moderate intensity exercise weekly for a healthy heart. Along with eating right and exercising, aim for at least 7-9 hours of sleep daily.  Eat more whole grains which includes barley, wheat berries, oats, brown rice and whole wheat pasta. Use healthy plant oils which include olive, soy, corn, sunflower and peanut. Limit your caffeine and sugary drinks. Limit your intake of fast foods. Limit milk and dairy products to one or two daily servings.   Patient was given opportunity to ask questions. Patient verbalized understanding of the plan and was able to repeat key elements of the plan. All questions were answered to their satisfaction.  Raman Dominyck Reser, DNP   I, Raman Kerney Hopfensperger have reviewed all documentation for this visit. The documentation on 02/22/21 for  the exam, diagnosis, procedures, and orders are all accurate and complete.    THE PATIENT IS ENCOURAGED TO PRACTICE SOCIAL DISTANCING DUE TO THE COVID-19 PANDEMIC.

## 2021-02-23 LAB — CBC
Hematocrit: 40 % (ref 34.0–46.6)
Hemoglobin: 13.4 g/dL (ref 11.1–15.9)
MCH: 30.7 pg (ref 26.6–33.0)
MCHC: 33.5 g/dL (ref 31.5–35.7)
MCV: 92 fL (ref 79–97)
Platelets: 240 10*3/uL (ref 150–450)
RBC: 4.36 x10E6/uL (ref 3.77–5.28)
RDW: 13 % (ref 11.7–15.4)
WBC: 7 10*3/uL (ref 3.4–10.8)

## 2021-02-23 LAB — CMP14+EGFR
ALT: 44 IU/L — ABNORMAL HIGH (ref 0–32)
AST: 22 IU/L (ref 0–40)
Albumin/Globulin Ratio: 1.9 (ref 1.2–2.2)
Albumin: 4.5 g/dL (ref 3.8–4.8)
Alkaline Phosphatase: 99 IU/L (ref 44–121)
BUN/Creatinine Ratio: 16 (ref 9–23)
BUN: 9 mg/dL (ref 6–20)
Bilirubin Total: 0.3 mg/dL (ref 0.0–1.2)
CO2: 22 mmol/L (ref 20–29)
Calcium: 9.4 mg/dL (ref 8.7–10.2)
Chloride: 105 mmol/L (ref 96–106)
Creatinine, Ser: 0.55 mg/dL — ABNORMAL LOW (ref 0.57–1.00)
Globulin, Total: 2.4 g/dL (ref 1.5–4.5)
Glucose: 121 mg/dL — ABNORMAL HIGH (ref 70–99)
Potassium: 4 mmol/L (ref 3.5–5.2)
Sodium: 141 mmol/L (ref 134–144)
Total Protein: 6.9 g/dL (ref 6.0–8.5)
eGFR: 125 mL/min/{1.73_m2} (ref >59)

## 2021-02-23 LAB — LIPID PANEL
Chol/HDL Ratio: 3.2 ratio (ref 0.0–4.4)
Cholesterol, Total: 178 mg/dL (ref 100–199)
HDL: 56 mg/dL (ref >39)
LDL Chol Calc (NIH): 104 mg/dL — ABNORMAL HIGH (ref 0–99)
Triglycerides: 100 mg/dL (ref 0–149)
VLDL Cholesterol Cal: 18 mg/dL (ref 5–40)

## 2021-02-23 LAB — HEMOGLOBIN A1C
Est. average glucose Bld gHb Est-mCnc: 108 mg/dL
Hgb A1c MFr Bld: 5.4 % (ref 4.8–5.6)

## 2021-02-23 LAB — VITAMIN D 25 HYDROXY (VIT D DEFICIENCY, FRACTURES): Vit D, 25-Hydroxy: 10.6 ng/mL — ABNORMAL LOW (ref 30.0–100.0)

## 2021-02-23 LAB — TSH+FREE T4
Free T4: 1.36 ng/dL (ref 0.82–1.77)
TSH: 1.72 u[IU]/mL (ref 0.450–4.500)

## 2021-02-23 LAB — HEPATITIS C ANTIBODY: Hep C Virus Ab: <0.1 s/co ratio (ref 0.0–0.9)

## 2021-02-23 LAB — HIV ANTIBODY (ROUTINE TESTING W REFLEX): HIV Screen 4th Generation wRfx: NONREACTIVE

## 2021-02-27 ENCOUNTER — Other Ambulatory Visit: Payer: Self-pay | Admitting: Nurse Practitioner

## 2021-02-27 DIAGNOSIS — E559 Vitamin D deficiency, unspecified: Secondary | ICD-10-CM

## 2021-02-27 MED ORDER — VITAMIN D (ERGOCALCIFEROL) 1.25 MG (50000 UNIT) PO CAPS: ORAL_CAPSULE | ORAL | 0 refills | Status: DC

## 2021-04-03 ENCOUNTER — Encounter: Payer: Self-pay | Admitting: Obstetrics and Gynecology

## 2021-04-03 ENCOUNTER — Other Ambulatory Visit: Payer: Self-pay

## 2021-04-03 ENCOUNTER — Ambulatory Visit: Payer: 59 | Admitting: Obstetrics and Gynecology

## 2021-04-03 ENCOUNTER — Other Ambulatory Visit (HOSPITAL_COMMUNITY)
Admission: RE | Admit: 2021-04-03 | Discharge: 2021-04-03 | Disposition: A | Discharge status: Home / Self Care | Payer: 59 | Source: Ambulatory Visit | Attending: Obstetrics and Gynecology | Admitting: Obstetrics and Gynecology

## 2021-04-03 VITALS — BP 143/88 | HR 77 | Ht 63.0 in | Wt 166.9 lb

## 2021-04-03 DIAGNOSIS — Z01419 Encounter for gynecological examination (general) (routine) without abnormal findings: Secondary | ICD-10-CM | POA: Diagnosis not present

## 2021-04-03 DIAGNOSIS — Z124 Encounter for screening for malignant neoplasm of cervix: Secondary | ICD-10-CM | POA: Insufficient documentation

## 2021-04-03 DIAGNOSIS — Z113 Encounter for screening for infections with a predominantly sexual mode of transmission: Secondary | ICD-10-CM

## 2021-04-03 DIAGNOSIS — N939 Abnormal uterine and vaginal bleeding, unspecified: Secondary | ICD-10-CM | POA: Diagnosis not present

## 2021-04-03 NOTE — Progress Notes (Signed)
GYNECOLOGY ANNUAL PREVENTATIVE CARE ENCOUNTER NOTE  Subjective:   Tiffany Guerrero is a 35 y.o. G0. female here for a annual gynecologic exam. Current complaints: irregular periods.   Has always had irregular periods. Was told as a young woman, it would "even out." This time, she had about a week of very dark brown fibrous discharge, then it will change to red. Red blood for about 2 weeks, now on her last day. Length varies significantly, sometimes less than a week but typically at least one week and usually 2-3 weeks. Last period was in June 2022. Was shorter than this period.   Does have abdominal pain that comes and goes and she is not sure if it is related to her periods. Describes it as similar to when "you get really hungry." She has seen doctor for this pain but has not had a diagnosis yet.   Does report some new onset dark hair growth on face as an adult.  In same sex relationship and declines contraception. May want kids at some point and is here to make sure abnormal periods are "okay" and that there are no major issues should she want to get pregnant.  Denies abnormal, discharge, pelvic pain, problems with intercourse or other gynecologic concerns. Accepts STI screen.   Gynecologic History Patient's last menstrual period was 03/12/2021 (exact date). Contraception: none Last Pap: 2018. Results: negative Last mammogram: n/a  Obstetric History OB History  Gravida Para Term Preterm AB Living  0 0 0 0 0 0  SAB IAB Ectopic Multiple Live Births  0 0 0 0 0    History reviewed. No pertinent past medical history.  Past Surgical History:  Procedure Laterality Date   WISDOM TOOTH EXTRACTION      Current Outpatient Medications on File Prior to Visit  Medication Sig Dispense Refill   Vitamin D, Ergocalciferol, (DRISDOL) 1.25 MG (50000 UNIT) CAPS capsule Take 1 capsule by mouth on Tues and 1 capsule on Fridays. 24 capsule 0   No current facility-administered  medications on file prior to visit.    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[employer]    Social Determinants of Health   Financial Resource Strain: Not on file  Food Insecurity: Not on file  Transportation Needs: Not on file  Physical Activity: Not on file  Stress: Not on file  Social Connections: Not on file  Intimate Partner Violence: Not on file    Family History  Problem Relation Age of Onset   Idiopathic pulmonary fibrosis Mother    Diabetes Father    Diabetes Brother    Cancer Paternal Grandfather    Alzheimer's disease Paternal Grandfather     The following portions of the patient's history were  reviewed and updated as appropriate: allergies, current medications, past family history, past medical history, past social history, past surgical history and problem list.  Review of Systems Pertinent items are noted in HPI.   Objective:  BP (!) 143/88   Pulse 77   Ht 5\' 3"  (1.6 m)   Wt 166 lb 14.4 oz (75.7 kg)   LMP 03/12/2021 (Exact Date)   BMI 29.57 kg/m  CONSTITUTIONAL: Well-developed, well-nourished female in no acute distress.  HENT:  Normocephalic, atraumatic, External right and left ear normal. Oropharynx is clear and moist EYES: Conjunctivae and EOM are normal. Pupils are equal, round, and reactive to light. No scleral icterus.  NECK: Normal range of motion,  supple, no masses.  Normal thyroid.  SKIN: Skin is warm and dry. No rash noted. Not diaphoretic. No erythema. No pallor. NEUROLOGIC: Alert and oriented to person, place, and time. Normal reflexes, muscle tone coordination. No cranial nerve deficit noted. PSYCHIATRIC: Normal mood and affect. Normal behavior. Normal judgment and thought content. CARDIOVASCULAR: Normal heart rate noted RESPIRATORY: Effort normal, no problems with respiration noted. BREASTS: Symmetric in size. No masses, skin changes, nipple drainage, or lymphadenopathy. ABDOMEN: Soft, no distention noted.  No tenderness, rebound or guarding.  PELVIC: Normal appearing external genitalia; normal appearing vaginal mucosa and cervix.  No abnormal discharge noted.  Pap smear obtained. Pelvic cultures obtained. Normal uterine size, no other palpable masses, no uterine or adnexal tenderness. MUSCULOSKELETAL: Normal range of motion. No tenderness.  No cyanosis, clubbing, or edema.  2+ distal pulses.  Exam done with chaperone present.  Assessment and Plan:   1. Abnormal uterine bleeding (AUB) Check PCOS labs, TSH was normal at PCP - FSH - DHEA-sulfate - Testosterone,Free and Total - Prolactin - 03/14/2021 PELVIC COMPLETE WITH TRANSVAGINAL; Future - will likely need hormonal management  2. Well woman exam Briefly reviewed fertility, would plan for REI referral if/when she wants to get pregnant  3. Routine screening for STI (sexually transmitted infection) - Cervicovaginal ancillary only( [place]) - RPR - Hepatitis B surface antigen Had HIV/Hep C at PCP  4. Cervical cancer screening - Cytology - PAP   Will follow up results of pap smear/STI screen and manage accordingly. Encouraged improvement in diet and exercise.   Accepts STI screen. COVID vaccine UTD Mammogram n/a Referral for colonoscopy n/a Flu vaccine declines   Routine preventative health maintenance measures emphasized. Please refer to After Visit Summary for  other counseling recommendations.    [place], MD, [facility]ending [facility] for [facility])

## 2021-04-03 NOTE — Progress Notes (Signed)
Establish care/ annual Reports irregular menses, last menses blood was "like paste", bleeding since 03/12/21. LMP before that June. No contraception, same sex marriage.

## 2021-04-04 LAB — CERVICOVAGINAL ANCILLARY ONLY
Chlamydia: NEGATIVE
Comment: NEGATIVE
Comment: NEGATIVE
Comment: NORMAL
Neisseria Gonorrhea: NEGATIVE
Trichomonas: NEGATIVE

## 2021-04-05 ENCOUNTER — Other Ambulatory Visit: Payer: Self-pay

## 2021-04-05 DIAGNOSIS — R7989 Other specified abnormal findings of blood chemistry: Secondary | ICD-10-CM

## 2021-04-05 LAB — DHEA-SULFATE: DHEA-SO4: 109 ug/dL (ref 84.8–378.0)

## 2021-04-05 LAB — CYTOLOGY - PAP
Comment: NEGATIVE
Diagnosis: NEGATIVE
High risk HPV: NEGATIVE

## 2021-04-05 LAB — HEPATITIS B SURFACE ANTIGEN: Hepatitis B Surface Ag: NEGATIVE

## 2021-04-05 LAB — TESTOSTERONE,FREE AND TOTAL
Testosterone, Free: 1 pg/mL (ref 0.0–4.2)
Testosterone: 16 ng/dL (ref 8–60)

## 2021-04-05 LAB — FOLLICLE STIMULATING HORMONE: FSH: 7 m[IU]/mL

## 2021-04-05 LAB — PROLACTIN: Prolactin: 23.4 ng/mL — ABNORMAL HIGH (ref 4.8–23.3)

## 2021-04-05 LAB — RPR: RPR Ser Ql: NONREACTIVE

## 2021-04-06 ENCOUNTER — Other Ambulatory Visit: Payer: 59

## 2021-04-06 ENCOUNTER — Other Ambulatory Visit: Payer: Self-pay

## 2021-04-06 DIAGNOSIS — R7989 Other specified abnormal findings of blood chemistry: Secondary | ICD-10-CM

## 2021-04-07 LAB — PROLACTIN: Prolactin: 29.7 ng/mL — ABNORMAL HIGH (ref 4.8–23.3)

## 2021-04-09 ENCOUNTER — Other Ambulatory Visit: Payer: Self-pay | Admitting: *Deleted

## 2021-04-09 ENCOUNTER — Ambulatory Visit (HOSPITAL_BASED_OUTPATIENT_CLINIC_OR_DEPARTMENT_OTHER): Payer: 59

## 2021-04-09 DIAGNOSIS — R7989 Other specified abnormal findings of blood chemistry: Secondary | ICD-10-CM

## 2021-04-09 NOTE — Progress Notes (Signed)
Referral placed per Dr Shawnie Pons notes on lab results.

## 2021-05-01 ENCOUNTER — Ambulatory Visit: Payer: 59 | Admitting: Obstetrics & Gynecology

## 2021-05-02 ENCOUNTER — Ambulatory Visit: Payer: 59 | Admitting: Obstetrics and Gynecology

## 2021-05-28 ENCOUNTER — Ambulatory Visit (HOSPITAL_BASED_OUTPATIENT_CLINIC_OR_DEPARTMENT_OTHER)
Admission: RE | Admit: 2021-05-28 | Discharge: 2021-05-28 | Disposition: A | Discharge status: Home / Self Care | Payer: 59 | Source: Ambulatory Visit | Attending: Obstetrics and Gynecology | Admitting: Obstetrics and Gynecology

## 2021-05-28 DIAGNOSIS — N939 Abnormal uterine and vaginal bleeding, unspecified: Secondary | ICD-10-CM | POA: Insufficient documentation

## 2021-06-05 ENCOUNTER — Other Ambulatory Visit: Payer: Self-pay

## 2021-06-05 DIAGNOSIS — E559 Vitamin D deficiency, unspecified: Secondary | ICD-10-CM

## 2021-06-05 MED ORDER — VITAMIN D (ERGOCALCIFEROL) 1.25 MG (50000 UNIT) PO CAPS: ORAL_CAPSULE | ORAL | 0 refills | Status: DC

## 2021-06-06 ENCOUNTER — Encounter: Payer: Self-pay | Admitting: Obstetrics and Gynecology

## 2021-06-06 ENCOUNTER — Other Ambulatory Visit: Payer: Self-pay

## 2021-06-06 ENCOUNTER — Ambulatory Visit: Payer: 59 | Admitting: Obstetrics and Gynecology

## 2021-06-06 VITALS — BP 128/85 | HR 67 | Ht 63.0 in | Wt 167.0 lb

## 2021-06-06 DIAGNOSIS — N926 Irregular menstruation, unspecified: Secondary | ICD-10-CM | POA: Diagnosis not present

## 2021-06-06 DIAGNOSIS — E221 Hyperprolactinemia: Secondary | ICD-10-CM | POA: Diagnosis not present

## 2021-06-06 MED ORDER — MEDROXYPROGESTERONE ACETATE 5 MG PO TABS: 5.0000 mg | ORAL_TABLET | Freq: Every day | ORAL | 1 refills | Status: DC

## 2021-06-06 NOTE — Patient Instructions (Signed)
D-chiro Inositol Myoinositol

## 2021-06-06 NOTE — Progress Notes (Signed)
Patient presents for follow up for AUB. Patient states that she is still having irregular bleeding. Patient states that she started bleeding again this past Saturday. Cycle before this last episode was 03/12/21 which lasted for about 2 weeks. Patient has no other concerns.

## 2021-06-06 NOTE — Progress Notes (Signed)
GYNECOLOGY OFFICE VISIT NOTE  History:   Tiffany Guerrero is a 36 y.o. G0P0000 here today for follow up from her lab work and [place].   She had labwork for PCOS, TSH, PRL. PRL found to be elevated persistently even on recheck although mildly so. She was referred to endocrinology but has not yet seen them.   She had an [place] which was overall normal except ovaries may have PCOS type appearance. She has unwanted hair growth on her face more recently. She also has some acne. She has oligomenorrhea - sometimes 5 months between periods. Her free testosterone was normal.    She denies any abnormal vaginal discharge, bleeding, pelvic pain or other concerns.     History reviewed. No pertinent past medical history.  Past Surgical History:  Procedure Laterality Date   WISDOM TOOTH EXTRACTION      The following portions of the patient's history were reviewed and updated as appropriate: allergies, current medications, past family history, past medical history, past social history, past surgical history and problem list.   Health Maintenance:   Normal pap and negative HRHPV:  Diagnosis  Date Value Ref Range Status  04/03/2021   Final   - Negative for intraepithelial lesion or malignancy (NILM)       Review of Systems:  Pertinent items noted in HPI and remainder of comprehensive ROS otherwise negative.  Physical Exam:  BP 128/85    Pulse 67    Ht 5\' 3"  (1.6 m)    Wt 167 lb (75.8 kg)    LMP 06/02/2021    BMI 29.58 kg/m  CONSTITUTIONAL: Well-developed, well-nourished female in no acute distress.  HEENT:  Normocephalic, atraumatic. External right and left ear normal. No scleral icterus.  NECK: Normal range of motion, supple, no masses noted on observation SKIN: No rash noted. Not diaphoretic. No erythema. No pallor. MUSCULOSKELETAL: Normal range of motion. No edema noted. NEUROLOGIC: Alert and oriented to person, place, and time. Normal muscle tone coordination. No cranial nerve deficit  noted. PSYCHIATRIC: Normal mood and affect. Normal behavior. Normal judgment and thought content.  CARDIOVASCULAR: Normal heart rate noted RESPIRATORY: Effort and breath sounds normal, no problems with respiration noted ABDOMEN: No masses noted. No other overt distention noted.    PELVIC: Deferred  Labs and Imaging No results found for this or any previous visit (from the past 168 hour(s)). 06/04/2021 PELVIC COMPLETE WITH TRANSVAGINAL  Result Date: 05/28/2021 CLINICAL DATA:  Abnormal uterine bleeding EXAM: TRANSABDOMINAL AND TRANSVAGINAL ULTRASOUND OF PELVIS DOPPLER ULTRASOUND OF OVARIES TECHNIQUE: Both transabdominal and transvaginal ultrasound examinations of the pelvis were performed. Transabdominal technique was performed for global imaging of the pelvis including uterus, ovaries, adnexal regions, and pelvic cul-de-sac. It was necessary to proceed with endovaginal exam following the transabdominal exam to visualize the ovaries. Color and duplex Doppler ultrasound was utilized to evaluate blood flow to the ovaries. COMPARISON:  CT done on 05/06/2018 FINDINGS: Uterus Measurements: 7.5 x 3.5 x 4 cm = volume: 54.7 mL. No fibroids or other mass visualized. Nabothian cysts are seen in the cervix. Endometrium Thickness: 10 mm. Prominence of endometrial stripe may be due to secretory phase of menstrual cycle. There is no significant abnormal increased vascularity in the endometrium. Right ovary Measurements: 2.3 x 3 x 3.6 cm = volume: 9.3 mL. There are subcentimeter follicles in the periphery. Left ovary Measurements: 2.3 x 3 x 2 cm = volume: 7.2 mL. Subcentimeter follicles are seen. Color doppler evaluation of both ovaries demonstrates vascular flow in both  adnexal regions. Other findings No abnormal free fluid. IMPRESSION: Prominence of endometrial stripe without increased vascularity most likely is due to secretory phase of menstrual cycle. There are subcentimeter follicles in the periphery of both ovaries which  may be normal variation or suggest polycystic ovary disease. Electronically Signed   By: Ernie Avena M.D.   On: 05/28/2021 10:06    Assessment and Plan:   1. Hyperprolactinemia (HCC) - Referral submitted to endocrinology  2. Irregular periods - She does meet clinical criteria for PCOS however given her hyperPRL, I would ensure this is addressed as this may resolve her other issues.  - Discussed diagnosis of PCOS and on what basis.  - Labwork for FLP and HgBA1C: Up to date - Reviewed possible help of Myoinositiol with weight loss. Offered referral to dietician. She  Declines at this time .  - Discussed importance of endometrial protection with different hormonal methods to prevent hyperplasia/malignancy in the setting of oligomenorrhea.  - We discussed OTC measures for unwanted hair and acne if she chooses to do so. We also discussed spironolactone. She declines this at this time.  - She would like to try:  Provera   Routine preventative health maintenance measures emphasized. Please refer to After Visit Summary for other counseling recommendations.   Return if symptoms worsen or fail to improve.  Milas Hock, MD, FACOG Obstetrician & Gynecologist, [facility] for [facility], [facility]

## 2021-06-13 ENCOUNTER — Telehealth: Payer: Self-pay

## 2021-06-13 NOTE — Telephone Encounter (Signed)
New pt appt scheduled.

## 2021-06-21 ENCOUNTER — Encounter: Payer: Self-pay | Admitting: Internal Medicine

## 2021-06-21 ENCOUNTER — Ambulatory Visit: Payer: 59 | Admitting: Internal Medicine

## 2021-06-21 ENCOUNTER — Other Ambulatory Visit: Payer: Self-pay

## 2021-06-21 VITALS — BP 130/88 | HR 72 | Ht 63.0 in | Wt 168.2 lb

## 2021-06-21 DIAGNOSIS — N926 Irregular menstruation, unspecified: Secondary | ICD-10-CM | POA: Diagnosis not present

## 2021-06-21 DIAGNOSIS — E65 Localized adiposity: Secondary | ICD-10-CM

## 2021-06-21 DIAGNOSIS — E221 Hyperprolactinemia: Secondary | ICD-10-CM | POA: Diagnosis not present

## 2021-06-21 MED ORDER — DEXAMETHASONE 1 MG PO TABS: ORAL_TABLET | ORAL | 0 refills | Status: DC

## 2021-06-21 NOTE — Patient Instructions (Addendum)
Please stop at the lab.  Take Dexamethasone at 11 pm the night before coming for labs between 8-9 am, fasting.  Please come back for a follow-up appointment in 6 months.  Prolactin Level Test Why am I having this test? The prolactin level test is often used to diagnose and monitor problems with the pituitary gland, such as pituitary tumors. It may also be used to help find the cause of certain other conditions, such as an abnormal absence of menstrual cycles (amenorrhea) or a thyroid gland that does not produce enough hormones (hypothyroidism). Your health care provider may order this test if you have: Irregular menstrual periods. Loss of libido. Milky fluid coming from your nipples (when not breastfeeding). Tiredness (fatigue). What is being tested? This test measures the amount of prolactin in your blood. Prolactin is a hormone that is produced by the pituitary gland. Prolactin levels normally go up and down, or fluctuate, due to stress, illness, trauma, or surgery. Increased levels can also be caused by tumors or other health problems. What kind of sample is taken? A blood sample is required for this test. It is usually collected by inserting a needle into a blood vessel. Tell a health care provider about: All medicines you are taking, including vitamins, herbs, eye drops, creams, and over-the-counter medicines. How are the results reported? Your test results will be reported as values that indicate the amount of prolactin in your blood. Your health care provider will compare your results to normal ranges that were established after testing a large group of people (reference ranges). Reference ranges may vary among labs and hospitals. For this test, common reference ranges are: Adult female: 3-13 ng/mL. Adult female: 3-27 ng/mL. Pregnant female: 20-400 ng/mL. What do the results mean? Increased levels of prolactin may mean that you have: A pituitary gland  tumor. Amenorrhea. Hypothyroidism. Certain pituitary or reproductive syndromes. Kidney failure. Decreased levels of prolactin may indicate: Lack of blood to the pituitary gland. Pituitary gland failure. Talk with your health care provider about what your results mean. Questions to ask your health care provider Ask your health care provider, or the department that is doing the test: When will my results be ready? How will I get my results? What are my treatment options? What other tests do I need? What are my next steps? Summary The prolactin level test is often used to diagnose and monitor problems with the pituitary gland, such as pituitary tumors. It may also be used to help find the cause of certain other conditions, such as amenorrhea or hypothyroidism. This test measures the amount of prolactin in your blood. Prolactin is a hormone that is produced by the pituitary gland. Prolactin levels normally go up and down, or fluctuate, due to stress, illness, trauma, or surgery. Increased levels can also be caused by tumors or other health problems. Talk with your health care provider about what your results mean. This information is not intended to replace advice given to you by your health care provider. Make sure you discuss any questions you have with your health care provider. Document Revised: 04/11/2020 Document Reviewed: 04/11/2020 Elsevier Patient Education  2022 [employer].

## 2021-06-21 NOTE — Progress Notes (Addendum)
Patient ID: Tiffany Guerrero, female   DOB: [birth year], 36 y.o.   MRN: [id number]  This visit occurred during the SARS-CoV-2 public health emergency.  Safety protocols were in place, including screening questions prior to the visit, additional usage of staff PPE, and extensive cleaning of exam room while observing appropriate contact time as indicated for disinfecting solutions.   HPI  Tiffany Guerrero is a 36 y.o.-year-old female, referred by her PCP, Dr. Alvy Bimler, for evaluation for hyperprolactinemia.  Pt. has been found to have a high prolactin level in 03/2021 during investigation for irregular menstrual cycles.  At that time, she presented with: -Irregular menstrual cycles -but she "always" had these, since menarche at 36 years old: skipping 3-4 mo (ave 3-4 cycles a year). She has spotting for up to 1 week before the cycles. -Hirsutism -Weight gain/inability to lose weight  But no: -Headaches -Galactorrhea  Patient prolactin levels were reviewed: Lab Results  Component Value Date   PROLACTIN 29.7 (H) 04/06/2021   PROLACTIN 23.4 (H) 04/03/2021   Patient's thyroid tests were also reviewed and these were normal: Lab Results  Component Value Date   TSH 1.720 02/22/2021   TSH 1.720 12/18/2016   FREET4 1.36 02/22/2021    Of note, her testosterone, FSH, and DHEA-S levels were normal: Component     Latest Ref Rng & Units 04/03/2021  Testosterone     8 - 60 ng/dL 16  Testosterone Free     0.0 - 4.2 pg/mL 1.0  FSH     mIU/mL 7.0  DHEA-SO4     84.8 - 378.0 ug/dL 947.6   She is not on Risperdal, oral contraceptives (she is on Provera only), Reglan.  She had a transvaginal ultrasound (05/28/2021): Prominence of endometrial stripe without increased vascularity most likely is due to secretory phase of menstrual cycle.   There are subcentimeter follicles in the periphery of both ovaries which may be normal variation or suggest polycystic ovary disease.  Pt. also has  a history of vitamin D deficiency-on ergocalciferol.  No consistent exercise. 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  No possibility of pregnancy for now.  ROS: Constitutional: + See HPI; no fatigue, no subjective hyperthermia, no subjective hypothermia, no nocturia Eyes: no blurry vision, no xerophthalmia ENT: no sore throat, no nodules felt in neck, no dysphagia, no odynophagia, no hoarseness, no tinnitus, no hypoacusis Cardiovascular: no CP, no SOB, no palpitations, no leg swelling Respiratory: no cough, no SOB, no wheezing Gastrointestinal: no N, no V, no D, no C, no acid reflux Musculoskeletal: no muscle, no joint aches Skin: no rash, no hair loss Neurological: no tremors, no numbness or tingling/no dizziness/no HAs Psychiatric: no depression, no anxiety  No past medical history on file. Past Surgical History:  Procedure Laterality Date   WISDOM TOOTH EXTRACTION     Social History   Socioeconomic History   Marital status: Married    Spouse name: Not on file   Number of children: 0   Years of education: Not on file   Highest education level: Not on file  Occupational History   Occupation: Engineer/analyst  Tobacco Use   Smoking status: Never   Smokeless tobacco: Never  Vaping Use   Vaping Use: Never used  Substance and Sexual Activity   Alcohol use: Yes    Comment: 1-2 wine glasses per month   Drug use: Never   Sexual activity: Yes    Birth control/protection: None    Comment: same sex relationship  Other  Topics Concern   Not on file  Social History Narrative   Art gallery manager with [employer]    Social Determinants of Health   Financial Resource Strain: Not on file  Food Insecurity: Not on file  Transportation Needs: Not on file  Physical Activity: Not on file  Stress: Not on file  Social Connections: Not on file  Intimate Partner Violence: Not on file   Current Outpatient Medications on File Prior to Visit  Medication Sig Dispense Refill    medroxyPROGESTERone (PROVERA) 5 MG tablet Take 1 tablet (5 mg total) by mouth daily. Use for 7 days in a row, period should come 3-5 days from stopping 28 tablet 1   Vitamin D, Ergocalciferol, (DRISDOL) 1.25 MG (50000 UNIT) CAPS capsule Take 1 capsule by mouth on Tues and 1 capsule on Fridays. 24 capsule 0   No current facility-administered medications on file prior to visit.   No Known Allergies Family History  Problem Relation Age of Onset   Idiopathic pulmonary fibrosis Mother    Diabetes Father    Diabetes Brother    Cancer Paternal Grandfather    Alzheimer's disease Paternal Grandfather    PE: BP 130/88 (BP Location: Right Arm, Patient Position: Sitting, Cuff Size: Normal)    Pulse 72    Ht 5\' 3"  (1.6 m)    Wt 168 lb 3.2 oz (76.3 kg)    LMP 06/02/2021    SpO2 98%    BMI 29.80 kg/m  Wt Readings from Last 3 Encounters:  06/21/21 168 lb 3.2 oz (76.3 kg)  06/06/21 167 lb (75.8 kg)  04/03/21 166 lb 14.4 oz (75.7 kg)   Constitutional: overweight - truncal obesity, in NAD, + increased interscapular fat pad (buffalo hump) Eyes: PERRLA, EOMI, no exophthalmos ENT: moist mucous membranes, no thyromegaly, no cervical lymphadenopathy Cardiovascular: RRR, No MRG Respiratory: CTA B Gastrointestinal: abdomen soft, NT, ND, BS+ Musculoskeletal: no deformities, strength intact in all 4 Skin: moist, warm, + pink stretch marks on anterior abdomen; acne spots on chin and sideburns., + Mild dark terminal hair on chin and sideburns, + acanthosis nigricans on neck Neurological: no tremor with outstretched hands, DTR normal in all 4  ASSESSMENT: 1.  Hyperprolactinemia  2.  Irregular menstrual cycles  3.  Buffalo hump  PLAN: Patient with new diagnosis of hyperprolactinemia during investigation for irregular menstrual cycles, which she had for her entire life - I discussed with the patient about possible etiologies of high prolactin levels: Pituitary microadenoma secreting prolactin, but  also: Pregnancy Hypothyroidism -she recently had normal tests Stress  Exercise  Lack of sleep  Medications (patient is not taking psychotropic medications or Reglan) Drugs (denies) Chest wall lesions (denies) Seizures (denies) Liver ds (no history of ~) Kidney disease (no history of ~) A teratoma containing pituitary cells that can develop into prolactinoma (very rarely) Macroprolactin (multimeric prolactin, especially in patients without galactorrhea) idiopathic - high prolactin most frequently impacts menstrual cycles, however, her prolactin level was lower than 30, so it would be more unusual to affect the menstrual cycles, but not impossible - we will recheck prolactin today - if prolactin is still high, the neck step would be to obtain a pituitary MRI - We discussed about possible treatment if prolactin remains elevated.  We have 2 options for treatment: Cabergoline and bromocriptine.  Cabergoline has the advantage of being administered usually 1-2 times a week, is better tolerated.  Bromocriptine is taken several times a day, has more side effects, and has no effect  on pituitary tumor size.  She agrees with the plan to start cabergoline if still needed - RTC for repeat prolactin level in 1.5 months if we start cabergoline, and in 6 months for another visit  2.  Irregular menstrual cycles -Recent testosterone and DHEA-S level was normal -She had a transvaginal ultrasound this month which showed peripheral small ovarian cysts, which could be normal variation or suggest PCOS -At this visit, we will check an androstenediol level and also a 17 hydroxyprogesterone to screen for CAH  3.  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 -Instructed her about how to do a dexamethasone suppression test  Orders Placed This Encounter  Procedures   Prolactin   17-Hydroxyprogesterone   Androstenedione   Dexamethasone, blood   Cortisol-am, blood   Dexamethasone suppression test is normal: Component     Latest Ref Rng & Units 06/25/2021  Cortisol - AM     mcg/dL 0.8 (L)   Prolactin, Androstenedione, and 17 hydroxyprogesterone levels are also normal: Component     Latest Ref Rng & Units 06/21/2021  Prolactin     ng/mL 10.1  17-OH-Progesterone, LC/MS/MS      ng/dL 70  ANDROSTENEDIONE     ng/dL [id number]    Carlus Pavlovristina Maudean Hoffmann, MD PhD [facility]

## 2021-06-25 ENCOUNTER — Other Ambulatory Visit (INDEPENDENT_AMBULATORY_CARE_PROVIDER_SITE_OTHER): Payer: 59

## 2021-06-25 ENCOUNTER — Other Ambulatory Visit: Payer: Self-pay

## 2021-06-25 DIAGNOSIS — E65 Localized adiposity: Secondary | ICD-10-CM | POA: Diagnosis not present

## 2021-06-29 LAB — ANDROSTENEDIONE: Androstenedione: 158 ng/dL

## 2021-06-29 LAB — PROLACTIN: Prolactin: 10.1 ng/mL

## 2021-06-29 LAB — 17-HYDROXYPROGESTERONE: 17-OH-Progesterone, LC/MS/MS: 70 ng/dL

## 2021-07-02 DIAGNOSIS — E221 Hyperprolactinemia: Secondary | ICD-10-CM | POA: Insufficient documentation

## 2021-07-02 DIAGNOSIS — N926 Irregular menstruation, unspecified: Secondary | ICD-10-CM | POA: Insufficient documentation

## 2021-07-02 DIAGNOSIS — E65 Localized adiposity: Secondary | ICD-10-CM | POA: Insufficient documentation

## 2021-07-04 LAB — DEXAMETHASONE, BLOOD: Dexamethasone, Serum: 536 ng/dL

## 2021-07-04 LAB — CORTISOL-AM, BLOOD: Cortisol - AM: 0.8 ug/dL — ABNORMAL LOW

## 2021-12-20 ENCOUNTER — Encounter: Payer: Self-pay | Admitting: Internal Medicine

## 2021-12-20 ENCOUNTER — Ambulatory Visit: Payer: 59 | Admitting: Internal Medicine

## 2021-12-20 VITALS — BP 120/74 | HR 68 | Ht 63.0 in | Wt 170.4 lb

## 2021-12-20 DIAGNOSIS — E559 Vitamin D deficiency, unspecified: Secondary | ICD-10-CM | POA: Diagnosis not present

## 2021-12-20 DIAGNOSIS — E65 Localized adiposity: Secondary | ICD-10-CM

## 2021-12-20 DIAGNOSIS — E221 Hyperprolactinemia: Secondary | ICD-10-CM | POA: Diagnosis not present

## 2021-12-20 DIAGNOSIS — E282 Polycystic ovarian syndrome: Secondary | ICD-10-CM | POA: Diagnosis not present

## 2021-12-20 LAB — HEMOGLOBIN A1C: Hgb A1c MFr Bld: 5.4 % (ref 4.6–6.5)

## 2021-12-20 LAB — VITAMIN D 25 HYDROXY (VIT D DEFICIENCY, FRACTURES): VITD: 12.14 ng/mL — ABNORMAL LOW (ref 30.00–100.00)

## 2021-12-20 NOTE — Patient Instructions (Signed)
Please stop at the lab.  You should have an endocrinology follow-up appointment in 1 year.  

## 2021-12-20 NOTE — Progress Notes (Signed)
Patient ID: Tiffany Guerrero, female   DOB: October 28, 1985, 35 y.o.   MRN: [id number]  HPI  Tiffany Guerrero is a 37 y.o.-year-old female, initially referred by her PCP, Dr. Alvy Bimler, returning for follow-up for irregular menstrual cycles and hyperprolactinemia.  Interim history: She has occasional HAs, related to the weather. She had spotting x2 mo - with 1 week break. Her ObGyn provider gave her Provera >> a normal cycle afterwards, now due for another cycle. She also noticed more acne recently.  Reviewed and addended history: Pt. has been found to have a high prolactin level in 03/2021 during investigation for irregular menstrual cycles.  At that time, she presented with: -Irregular menstrual cycles -but she "always" had these, since menarche at 36 years old: skipping 3-4 mo (ave 3-4 cycles a year). She has spotting for up to 1 week before the cycles. -Hirsutism -Weight gain/inability to lose weight  But no: -Galactorrhea  Patient prolactin levels were reviewed: Lab Results  Component Value Date   PROLACTIN 10.1 06/21/2021   PROLACTIN 29.7 (H) 04/06/2021   PROLACTIN 23.4 (H) 04/03/2021   Patient's thyroid tests were also reviewed and these were normal: Lab Results  Component Value Date   TSH 1.720 02/22/2021   TSH 1.720 12/18/2016   FREET4 1.36 02/22/2021    Of note, her testosterone, FSH, and DHEA-S levels were normal: Component     Latest Ref Rng & Units 04/03/2021  Testosterone     8 - 60 ng/dL 16  Testosterone Free     0.0 - 4.2 pg/mL 1.0  FSH     mIU/mL 7.0  DHEA-SO4     84.8 - 378.0 ug/dL 361.4   Dexamethasone suppression test was normal: Component     Latest Ref Rng & Units 06/25/2021  Cortisol - AM     mcg/dL 0.8 (L)   Prolactin, Androstenedione, and 17 hydroxyprogesterone levels were also normal: Component     Latest Ref Rng & Units 06/21/2021  Prolactin     ng/mL 10.1  17-OH-Progesterone, LC/MS/MS      ng/dL 70  ANDROSTENEDIONE     ng/dL 431    She is not on Risperdal, oral contraceptives, Reglan.  She had a transvaginal ultrasound (05/28/2021): Prominence of endometrial stripe without increased vascularity most likely is due to secretory phase of menstrual cycle.   There are subcentimeter follicles in the periphery of both ovaries which may be normal variation or suggest polycystic ovary disease.  Pt. also had vitamin D deficiency - prev. on ergocalciferol, but ran out: Lab Results  Component Value Date   VD25OH 10.6 (L) 02/22/2021  No consistent exercise. 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   No possibility of pregnancy for now.  ROS: Constitutional: + See HPI  Past Medical History:  Diagnosis Date   Heart murmur 2020   Past Surgical History:  Procedure Laterality Date   WISDOM TOOTH EXTRACTION     Social History   Socioeconomic History   Marital status: Married    Spouse name: Not on file   Number of children: 0   Years of education: Not on file   Highest education level: Not on file  Occupational History   Occupation: Engineer/analyst  Tobacco Use   Smoking status: Never   Smokeless tobacco: Never  Vaping Use   Vaping Use: Never used  Substance and Sexual Activity   Alcohol use: Yes    Comment: 1-2 wine glasses per month   Drug use: Never   Sexual  activity: Yes    Birth control/protection: None    Comment: same sex relationship  Other Topics Concern   Not on file  Social History Narrative   Art gallery manager with [employer]    Social Determinants of Health   Financial Resource Strain: Not on file  Food Insecurity: Not on file  Transportation Needs: Not on file  Physical Activity: Not on file  Stress: Not on file  Social Connections: Not on file  Intimate Partner Violence: Not on file   Current Outpatient Medications on File Prior to Visit  Medication Sig Dispense Refill   dexamethasone (DECADRON) 1 MG tablet Take 1 tablet by mouth once at 11 pm, before coming for labs  at 8 am the next morning 1 tablet 0   medroxyPROGESTERone (PROVERA) 5 MG tablet Take 1 tablet (5 mg total) by mouth daily. Use for 7 days in a row, period should come 3-5 days from stopping 28 tablet 1   Vitamin D, Ergocalciferol, (DRISDOL) 1.25 MG (50000 UNIT) CAPS capsule Take 1 capsule by mouth on Tues and 1 capsule on Fridays. 24 capsule 0   No current facility-administered medications on file prior to visit.   No Known Allergies Family History  Problem Relation Age of Onset   Idiopathic pulmonary fibrosis Mother    Diabetes Father    Diabetes Brother    Cancer Paternal Grandfather    Alzheimer's disease Paternal Grandfather    PE: There were no vitals taken for this visit. Wt Readings from Last 3 Encounters:  06/21/21 168 lb 3.2 oz (76.3 kg)  06/06/21 167 lb (75.8 kg)  04/03/21 166 lb 14.4 oz (75.7 kg)   Constitutional: overweight - truncal obesity, in NAD, + increased interscapular fat pad (buffalo hump) Eyes: PERRLA, EOMI, no exophthalmos ENT: moist mucous membranes, no thyromegaly, no cervical lymphadenopathy Cardiovascular: RRR, No MRG Respiratory: CTA B Musculoskeletal: no deformities Skin: moist, warm, + pink stretch marks on anterior abdomen; acne spots on chin and sideburns., + Mild dark terminal hair on chin and sideburns, + acanthosis nigricans on neck Neurological: no tremor with outstretched hands  ASSESSMENT: 1.  Hyperprolactinemia  2.  Irregular menstrual cycles  3.  Buffalo hump  4. Vit D deficiency  PLAN: Patient with slightly high prolactin levels before last visit, during investigation for irregular menstrual cycles. -At last visit, we discussed about possible etiologies for high prolactin levels: Pituitary microadenoma secreting prolactin, but also: Pregnancy Hypothyroidism -she recently had normal tests Stress  Exercise  Lack of sleep  Medications (nopsychotropic medications or Reglan) Drugs (denies) Chest wall lesions (denies) Seizures  (denies) Liver ds (no history of ~) Kidney disease (no history of ~) A teratoma containing pituitary cells that can develop into prolactinoma (very rarely) Macroprolactin (multimeric prolactin, especially in patients without galactorrhea) idiopathic -She had a slightly high prolactin, but to impact her menstrual cycles, prolactin is usually higher, while her level was lower than 30. -At last visit, prolactin level was normal -At today's visit, we will recheck her prolactin level.  If normal, no further investigation is needed.  2.  Mild PCOS -She has had irregular menstrual cycles for her entire life -Testosterone and DHEA-S levels were normal -She had a transvaginal ultrasound this month which showed peripheral small ovarian cysts, which could be normal variation or suggest PCOS -At last visit, we checked an androstenedione and 17 hydroxyprogesterone level and they were normal, making CAH unlikely -Due to her irregular menstrual cycles and hirsutism, we can assume that she has mild  PCOS -Of note, slightly higher prolactin level can be seen with PCOS -At this visit, we discussed about options for treatment for her irregular menstrual cycles: OCPs, metformin, Provera.  Discussed about advantages and disadvantages of each of them.  Discussed that ideally she would have at least 4 menstrual cycles a year to prevent endometrial hyperplasia.  For now, she opted to continue with Provera every 3 months.  3.  Buffalo hump -She felt that this appeared after she gained weight in college -She works on a computer and feels that she has poor posture -No falls apply topical affect as -No history of exogenous steroid use -At last visit, due to the irregular menstrual cycles and acanthosis nigricans, we screened her for Cushing syndrome with a dexamethasone suppression test.  However, this returns normal, with an appropriately suppressed cortisol, ruling out Cushing syndrome. -No further investigation is  needed for this  4. Vitamin D deficiency - last vit D was very low, at 10.6 (02/2021) -she was on Ergocalciferol, but ran out months ago -will recheck today  Component     Latest Ref Rng 12/20/2021  Hemoglobin A1C     4.6 - 6.5 % 5.4   Prolactin     ng/mL 14.2   VITD     30.00 - 100.00 ng/mL 12.14 (L)   HbA1c and prolactin levels are normal. Vitamin D level is very low.  I would suggest 5000 units daily and to recheck the level in 2 to 3 months.  I will suggest to follow-up with PCP or OB/GYN.  For this.  Carlus Pavlov, MD PhD [facility]

## 2021-12-21 LAB — PROLACTIN: Prolactin: 14.2 ng/mL

## 2022-05-07 ENCOUNTER — Encounter: Payer: Self-pay | Admitting: Nurse Practitioner

## 2022-05-07 ENCOUNTER — Ambulatory Visit (INDEPENDENT_AMBULATORY_CARE_PROVIDER_SITE_OTHER): Payer: 59 | Admitting: Nurse Practitioner

## 2022-05-07 VITALS — BP 106/74 | HR 69 | Temp 98.1°F | Ht 63.0 in | Wt 168.2 lb

## 2022-05-07 DIAGNOSIS — M25541 Pain in joints of right hand: Secondary | ICD-10-CM | POA: Diagnosis not present

## 2022-05-07 DIAGNOSIS — Z0000 Encounter for general adult medical examination without abnormal findings: Secondary | ICD-10-CM

## 2022-05-07 DIAGNOSIS — R82998 Other abnormal findings in urine: Secondary | ICD-10-CM

## 2022-05-07 DIAGNOSIS — N926 Irregular menstruation, unspecified: Secondary | ICD-10-CM | POA: Diagnosis not present

## 2022-05-07 DIAGNOSIS — E559 Vitamin D deficiency, unspecified: Secondary | ICD-10-CM | POA: Diagnosis not present

## 2022-05-07 DIAGNOSIS — R300 Dysuria: Secondary | ICD-10-CM

## 2022-05-07 DIAGNOSIS — Z6829 Body mass index (BMI) 29.0-29.9, adult: Secondary | ICD-10-CM

## 2022-05-07 LAB — POCT URINALYSIS DIPSTICK
Bilirubin, UA: NEGATIVE
Glucose, UA: POSITIVE — AB
Ketones, UA: NEGATIVE
Nitrite, UA: NEGATIVE
Protein, UA: POSITIVE — AB
Spec Grav, UA: 1.01 (ref 1.010–1.025)
Urobilinogen, UA: 0.2 E.U./dL
pH, UA: 6 (ref 5.0–8.0)

## 2022-05-07 NOTE — Progress Notes (Signed)
I,Victoria T Hamilton,acting as a Education administrator for Minette Brine, FNP.,have documented all relevant documentation on the behalf of Minette Brine, FNP,as directed by  Minette Brine, FNP while in the presence of Minette Brine, [place].     Subjective:     Patient ID: Tiffany Guerrero , female    DOB: Dec 20, 1985 , 36 y.o.   MRN: [id number]   Chief Complaint  Patient presents with   Annual Exam    HPI  Here for HM. She is followed by Dr. Renne Crigler for her abnormal menses, started on vitamin d 50,000 units twice a week.      Past Medical History:  Diagnosis Date   Heart murmur 2020     Family History  Problem Relation Age of Onset   Idiopathic pulmonary fibrosis Mother    Diabetes Father    Diabetes Brother    Cancer Paternal Grandfather    Alzheimer's disease Paternal Grandfather      Current Outpatient Medications:    medroxyPROGESTERone (PROVERA) 5 MG tablet, Take 1 tablet (5 mg total) by mouth daily. Use for 7 days in a row, period should come 3-5 days from stopping, Disp: 28 tablet, Rfl: 1   nitrofurantoin, macrocrystal-monohydrate, (MACROBID) 100 MG capsule, Take 1 capsule (100 mg total) by mouth 2 (two) times daily for 5 days., Disp: 10 capsule, Rfl: 0   Vitamin D, Ergocalciferol, (DRISDOL) 1.25 MG (50000 UNIT) CAPS capsule, Take 1 capsule (50,000 Units total) by mouth 2 (two) times a week. Taken on Tuesday & Thursday, Disp: 24 capsule, Rfl: 1   No Known Allergies    The patient states she uses none for birth control.  No LMP recorded. (Menstrual status: Irregular Periods).. Negative for Dysmenorrhea and Negative for Menorrhagia. Negative for: breast discharge, breast lump(s), breast pain and breast self exam. Associated symptoms include abnormal vaginal bleeding. Pertinent negatives include abnormal bleeding (hematology), anxiety, decreased libido, depression, difficulty falling sleep, dyspareunia, history of infertility, nocturia, sexual dysfunction, sleep disturbances, urinary  incontinence, urinary urgency, vaginal discharge and vaginal itching. Diet regular - she eats yogurt, fruits and coffee, grilled chicken and vegetables and dinner - left overs.  The patient states her exercise level is minimal - she has been busier at work so not able to exercise as much.   The patient's tobacco use is:  Social History   Tobacco Use  Smoking Status Never  Smokeless Tobacco Never   She has been exposed to passive smoke. The patient's alcohol use is:  Social History   Substance and Sexual Activity  Alcohol Use Yes   Comment: 1-2 wine glasses per month  Additional information: Last pap 04/03/2021, next one scheduled for 04/03/2022.    Review of Systems  Constitutional: Negative.   HENT: Negative.    Eyes: Negative.   Respiratory: Negative.    Cardiovascular: Negative.   Gastrointestinal: Negative.   Endocrine: Negative.   Genitourinary: Negative.   Musculoskeletal: Negative.   Skin: Negative.   Allergic/Immunologic: Negative.   Neurological:  Positive for numbness (right 2nd finger intermittent).  Hematological: Negative.   Psychiatric/Behavioral: Negative.       Today's Vitals   05/07/22 1549  BP: 106/74  Pulse: 69  Temp: 98.1 F (36.7 C)  SpO2: 98%  Weight: 168 lb 3.2 oz (76.3 kg)  Height: _0  (1.6 m)  PainSc: 0-No pain   Body mass index is 29.8 kg/m.   Objective:  Physical Exam Constitutional:      General: She is not in acute distress.  Appearance: Normal appearance. She is well-developed.  HENT:     Head: Normocephalic and atraumatic.     Right Ear: Hearing, tympanic membrane, ear canal and external ear normal. There is no impacted cerumen.     Left Ear: Hearing, tympanic membrane, ear canal and external ear normal. There is no impacted cerumen.     Nose: Nose normal.     Mouth/Throat:     Mouth: Mucous membranes are moist.  Eyes:     General: Lids are normal.     Extraocular Movements: Extraocular movements intact.      Conjunctiva/sclera: Conjunctivae normal.     Pupils: Pupils are equal, round, and reactive to light.     Funduscopic exam:    Right eye: No papilledema.        Left eye: No papilledema.  Neck:     Thyroid: No thyroid mass.     Vascular: No carotid bruit.  Cardiovascular:     Rate and Rhythm: Normal rate and regular rhythm.     Pulses: Normal pulses.     Heart sounds: Normal heart sounds. No murmur heard. Pulmonary:     Effort: Pulmonary effort is normal. No respiratory distress.     Breath sounds: Normal breath sounds. No wheezing.  Chest:     Chest wall: No mass.  Breasts:    Tanner Score is 5.     Right: Normal. No mass or tenderness.     Left: Normal. No mass or tenderness.  Abdominal:     General: Abdomen is flat. Bowel sounds are normal. There is no distension.     Palpations: Abdomen is soft.     Tenderness: There is no abdominal tenderness.  Genitourinary:    Rectum: Guaiac result negative.  Musculoskeletal:        General: No swelling. Normal range of motion.     Cervical back: Full passive range of motion without pain, normal range of motion and neck supple.     Right lower leg: No edema.     Left lower leg: No edema.  Lymphadenopathy:     Upper Body:     Right upper body: No supraclavicular, axillary or pectoral adenopathy.     Left upper body: No supraclavicular, axillary or pectoral adenopathy.  Skin:    General: Skin is warm and dry.     Capillary Refill: Capillary refill takes less than 2 seconds.  Neurological:     General: No focal deficit present.     Mental Status: She is alert and oriented to person, place, and time.     Cranial Nerves: No cranial nerve deficit.     Sensory: No sensory deficit.     Motor: No weakness.  Psychiatric:        Mood and Affect: Mood normal.        Behavior: Behavior normal.        Thought Content: Thought content normal.        Judgment: Judgment normal.         Assessment And Plan:     1. Encounter for annual  physical exam Behavior modifications discussed and diet history reviewed.   Pt will continue to exercise regularly and modify diet with low GI, plant based foods and decrease intake of processed foods.  Recommend intake of daily multivitamin, Vitamin D, and calcium.  Recommend self breast exams monthly for preventive screenings, as well as recommend immunizations that include influenza, TDAP - CMP14+EGFR - Lipid panel  2. Vitamin D deficiency  Will check vitamin D level and supplement as needed.    Also encouraged to spend 15 minutes in the sun daily.  - VITAMIN D 25 Hydroxy (Vit-D Deficiency, Fractures)  3. Menstrual periods irregular - CBC  4. BMI 29.0-29.9,adult  5. Arthralgia of right hand Comments: Will check autoimmune panel. Will also check vitamin d - Autoimmune Profile - Sed Rate (ESR)  6. Dysuria Comments: Large blood and trace white cells, will send urine culture. - POCT Urinalysis Dipstick (81002)  7. Leukocytes in urine - Culture, Urine     Patient was given opportunity to ask questions. Patient verbalized understanding of the plan and was able to repeat key elements of the plan. All questions were answered to their satisfaction.   Minette Brine, FNP   I, Minette Brine, FNP, have reviewed all documentation for this visit. The documentation on 05/07/22 for the exam, diagnosis, procedures, and orders are all accurate and complete.   THE PATIENT IS ENCOURAGED TO PRACTICE SOCIAL DISTANCING DUE TO THE COVID-19 PANDEMIC.

## 2022-05-07 NOTE — Patient Instructions (Signed)

## 2022-05-08 ENCOUNTER — Encounter: Payer: Self-pay | Admitting: Internal Medicine

## 2022-05-08 LAB — CMP14+EGFR
ALT: 65 IU/L — ABNORMAL HIGH (ref 0–32)
AST: 37 IU/L (ref 0–40)
Albumin/Globulin Ratio: 1.8 (ref 1.2–2.2)
Albumin: 4.6 g/dL (ref 3.9–4.9)
Alkaline Phosphatase: 123 IU/L — ABNORMAL HIGH (ref 44–121)
BUN/Creatinine Ratio: 15 (ref 9–23)
BUN: 9 mg/dL (ref 6–20)
Bilirubin Total: 0.4 mg/dL (ref 0.0–1.2)
CO2: 23 mmol/L (ref 20–29)
Calcium: 9.6 mg/dL (ref 8.7–10.2)
Chloride: 100 mmol/L (ref 96–106)
Creatinine, Ser: 0.59 mg/dL (ref 0.57–1.00)
Globulin, Total: 2.5 g/dL (ref 1.5–4.5)
Glucose: 83 mg/dL (ref 70–99)
Potassium: 3.9 mmol/L (ref 3.5–5.2)
Sodium: 138 mmol/L (ref 134–144)
Total Protein: 7.1 g/dL (ref 6.0–8.5)
eGFR: 121 mL/min/{1.73_m2} (ref >59)

## 2022-05-08 LAB — LIPID PANEL
Chol/HDL Ratio: 3.2 ratio (ref 0.0–4.4)
Cholesterol, Total: 217 mg/dL — ABNORMAL HIGH (ref 100–199)
HDL: 68 mg/dL (ref >39)
LDL Chol Calc (NIH): 128 mg/dL — ABNORMAL HIGH (ref 0–99)
Triglycerides: 121 mg/dL (ref 0–149)
VLDL Cholesterol Cal: 21 mg/dL (ref 5–40)

## 2022-05-08 LAB — SEDIMENTATION RATE: Sed Rate: 34 mm/hr — ABNORMAL HIGH (ref 0–32)

## 2022-05-08 LAB — AUTOIMMUNE PROFILE
Anti Nuclear Antibody (ANA): POSITIVE — AB
Complement C3, Serum: 192 mg/dL — ABNORMAL HIGH (ref 82–167)
dsDNA Ab: <1 IU/mL (ref 0–9)

## 2022-05-08 LAB — CBC
Hematocrit: 39.8 % (ref 34.0–46.6)
Hemoglobin: 14 g/dL (ref 11.1–15.9)
MCH: 31.7 pg (ref 26.6–33.0)
MCHC: 35.2 g/dL (ref 31.5–35.7)
MCV: 90 fL (ref 79–97)
Platelets: 273 10*3/uL (ref 150–450)
RBC: 4.41 x10E6/uL (ref 3.77–5.28)
RDW: 13.2 % (ref 11.7–15.4)
WBC: 8.2 10*3/uL (ref 3.4–10.8)

## 2022-05-08 LAB — VITAMIN D 25 HYDROXY (VIT D DEFICIENCY, FRACTURES): Vit D, 25-Hydroxy: 24.3 ng/mL — ABNORMAL LOW (ref 30.0–100.0)

## 2022-05-10 LAB — URINE CULTURE

## 2022-05-11 ENCOUNTER — Ambulatory Visit: Admit: 2022-05-11 | Discharge status: Against Medical Advice | Payer: 59

## 2022-05-15 ENCOUNTER — Other Ambulatory Visit: Payer: Self-pay | Admitting: Nurse Practitioner

## 2022-05-15 DIAGNOSIS — M25541 Pain in joints of right hand: Secondary | ICD-10-CM

## 2022-05-15 DIAGNOSIS — R768 Other specified abnormal immunological findings in serum: Secondary | ICD-10-CM

## 2022-05-15 DIAGNOSIS — E559 Vitamin D deficiency, unspecified: Secondary | ICD-10-CM

## 2022-05-15 MED ORDER — VITAMIN D (ERGOCALCIFEROL) 1.25 MG (50000 UNIT) PO CAPS: 50000.0000 [IU] | ORAL_CAPSULE | ORAL | 1 refills | Status: DC

## 2022-05-16 ENCOUNTER — Other Ambulatory Visit: Payer: Self-pay | Admitting: Nurse Practitioner

## 2022-05-16 MED ORDER — NITROFURANTOIN MONOHYD MACRO 100 MG PO CAPS: 100.0000 mg | ORAL_CAPSULE | Freq: Two times a day (BID) | ORAL | 0 refills | Status: AC

## 2022-10-22 ENCOUNTER — Other Ambulatory Visit: Payer: Self-pay | Admitting: Nurse Practitioner

## 2022-10-22 DIAGNOSIS — E559 Vitamin D deficiency, unspecified: Secondary | ICD-10-CM

## 2022-11-24 NOTE — Progress Notes (Signed)
4  Office Visit Note  Patient: Tiffany Guerrero             Date of Birth: 1986-04-08           MRN: [id number]             PCP: Arnette Felts, FNP Referring: Arnette Felts, FNP Visit Date: 12/05/2022 Occupation: @GUAROCC @  Subjective:  History of pain in hands  History of Present Illness: Tiffany Guerrero is a 37 y.o. female seen in consultation per request of her PCP.  According the patient her symptoms started in September 2023 when she developed a rash on her both arms.  She states the rash was not pruritic and lasted for about a month.  Continues to get worse when she was exposed to sunlight.  She had no recurrence of rash since then.  In October 2023 she started noticing decreased grip strength in her hands which improved but she noticed swelling in her right middle finger which lasted for about month and then resolved.  She states she is still have trouble with fine motor movement.  She is was seen by her PCP in December 2023 at that time she labs were obtained and she was referred to me.  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  She is on the computer all day and sits.  She states towards the end of the day she starts having pain in her lower back and her right hip.  The pain is localized and does not radiate.  She does not have any morning stiffness.  There is no personal history of psoriasis, diarrhea, constipation, uveitis, Achilles tendinitis of Planter fasciitis.  There is no family history of autoimmune disease.  There is no history of DVTs.    Activities of Daily Living:  Patient reports morning stiffness for 0 minutes.   Patient Denies nocturnal pain.  Difficulty dressing/grooming: Denies Difficulty climbing stairs: Denies Difficulty getting out of chair: Denies Difficulty using hands for taps, buttons, cutlery, and/or writing: Reports  Review of Systems  Constitutional:  Positive for fatigue.  HENT:  Positive  for mouth sores. Negative for mouth dryness.        History of fever blisters  Eyes:  Negative for dryness.  Respiratory:  Negative for difficulty breathing.        With exertion   Cardiovascular:  Negative for chest pain and palpitations.  Gastrointestinal:  Negative for blood in stool, constipation and diarrhea.  Endocrine: Negative for increased urination.  Genitourinary:  Negative for involuntary urination.  Musculoskeletal:  Positive for muscle weakness and muscle tenderness. Negative for joint pain, gait problem, joint pain, joint swelling, myalgias, morning stiffness and myalgias.  Skin:  Positive for hair loss. Negative for color change, rash and sensitivity to sunlight.  Allergic/Immunologic: Negative for susceptible to infections.  Neurological:  Negative for dizziness and headaches.  Hematological:  Negative for swollen glands.  Psychiatric/Behavioral:  Negative for depressed mood and sleep disturbance. The patient is not nervous/anxious.     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  Constitutional:      Appearance: She is well-developed.  HENT:     Head: Normocephalic and atraumatic.  Eyes:     Conjunctiva/sclera: Conjunctivae normal.  Cardiovascular:     Rate and Rhythm: Normal rate and regular rhythm.     Heart sounds: Normal heart sounds.  Pulmonary:     Effort: Pulmonary effort is normal.     Breath sounds: Normal breath sounds.  Abdominal:     General: Bowel sounds are normal.     Palpations: Abdomen is soft.  Musculoskeletal:     Cervical back: Normal range of motion.  Lymphadenopathy:     Cervical: No cervical adenopathy.  Skin:    General: Skin is warm and dry.     Capillary Refill: Capillary refill takes less than 2 seconds.  Neurological:     Mental Status: She is alert and oriented to person, place, and time.  Psychiatric:        Behavior: Behavior normal.      Musculoskeletal Exam: Cervical, thoracic and lumbar spine were in good range of motion.  She had discomfort in the lower lumbar region with range of motion.  Shoulder joints, elbow joints, wrist joints, MCPs PIPs and DIPs with good range of motion with no synovitis.  Hip joints and knee joints were in good range of motion.  There was no tenderness over ankles or MTPs.  CDAI Exam: CDAI Score: -- Patient Global: --; Provider Global: -- Swollen: --; Tender: -- Joint Exam 12/05/2022   No joint exam has been documented for this visit   There is currently no information documented on the homunculus. Go to the Rheumatology activity and complete the homunculus joint exam.  Investigation: No additional findings.  Imaging: No results found.  Recent Labs: Lab Results  Component Value Date   WBC 8.2 05/07/2022   HGB 14.0 05/07/2022   PLT 273 05/07/2022   NA 138 05/07/2022   K 3.9 05/07/2022   CL 100 05/07/2022   CO2 23 05/07/2022   GLUCOSE 83 05/07/2022   BUN 9 05/07/2022   CREATININE 0.59 05/07/2022    BILITOT 0.4 05/07/2022   ALKPHOS 123 (H) 05/07/2022   AST 37 05/07/2022   ALT 65 (H) 05/07/2022   PROT 7.1 05/07/2022   ALBUMIN 4.6 05/07/2022   CALCIUM 9.6 05/07/2022   GFRAA 141 11/17/2018    Speciality Comments: No specialty comments available.  Procedures:  No procedures performed Allergies: Patient has no known allergies.   Assessment / Plan:     Visit Diagnoses: Positive ANA (antinuclear antibody) - 05/07/22: ANA+, ESR 34, dsDNA<1, C3 192, Vitamin D 24.3 -patient is positive ANA with no titer given.  Patient states she developed rash on her arms since September 2023 which was photosensitive.  She had no recurrence of rash  since then.  I will obtain additional labs today.  Plan: COMPLETE METABOLIC PANEL WITH GFR, Sedimentation rate, ANA, RNP Antibody, Anti-Smith antibody, Sjogrens syndrome-A extractable nuclear antibody, Sjogrens syndrome-B extractable nuclear antibody, C3 and C4  Arthralgia of both hands -she complains of stiffness in discomfort in her hands which became more prominent in October.  The symptoms improved by December.  She still have some problems with fine motor movement.  She works as a Architectural technologist and nails.  She has to use her hands a lot.  No synovitis noted on the examination today.  Plan: XR Hand 2 View Right, XR Hand 2 View Left, x-rays of bilateral hands were unremarkable.  Rheumatoid factor, Cyclic citrul peptide antibody, IgG.  A handout on hand exercises was given.  Chronic midline low back pain without sciatica -she complains of ongoing pain and discomfort in her lower back for the last few months.  She describes pain in the lower lumbar region and also in her SI joints.  She denies any radiculopathy.  The pain is most likely from prolonged sitting.  I gave her a handout on lumbar spine exercises.  I also encouraged swimming and water aerobics for mobility.  Plan: XR Lumbar Spine 2-3 Views.  X-rays of lumbar spine were  unremarkable.  Hyperprolactinemia (HCC)-patient was evaluated by endocrinology and was told that she had mild hyperprolactinemia.  No further workup was needed.  She was also told that she had mild  Menstrual periods irregular - takes Provera prn  Elevated blood pressure reading-blood pressure was elevated at 145/98.  Repeat blood pressure was 128/88.  She was advised to monitor blood pressure closely and follow-up with the PCP if the blood pressure stays elevated.  Vitamin D deficiency -patient has history of vitamin D deficiency.  She has not had vitamin D checked in the last few months.  She stopped taking vitamin D.  She is not taking any supplements at this time.  Will check vitamin D level today.  Plan: VITAMIN D 25 Hydroxy (Vit-D Deficiency, Fractures)  Orders: Orders Placed This Encounter  Procedures   XR Hand 2 View Right   XR Hand 2 View Left   XR Lumbar Spine 2-3 Views   COMPLETE METABOLIC PANEL WITH GFR   Sedimentation rate   VITAMIN D 25 Hydroxy (Vit-D Deficiency, Fractures)   ANA   RNP Antibody   Anti-Smith antibody   Sjogrens syndrome-A extractable nuclear antibody   Sjogrens syndrome-B extractable nuclear antibody   C3 and C4   Rheumatoid factor   Cyclic citrul peptide antibody, IgG   No orders of the defined types were placed in this encounter.  .  Follow-Up Instructions: Return for Pain in multiple joints.   Pollyann Savoy, MD  Note - This record has been created using Animal nutritionist.  Chart creation errors have been sought, but may not always  have been located. Such creation errors do not reflect on  the standard of medical care.

## 2022-12-04 NOTE — Progress Notes (Signed)
Office Visit Note  Patient: Tiffany Guerrero             Date of Birth: [birth year]           MRN: [id number]             PCP: Arnette Felts, FNP Referring: Arnette Felts, FNP Visit Date: 12/18/2022 Occupation: @GUAROCC @  Subjective:  Pain in hands and lower back  History of Present Illness: Tiffany Guerrero is a 37 y.o. female returns today after her initial visit for the evaluation of joint discomfort and positive ANA.  She states that joint discomfort is improved over time.  She has been doing stretching exercises for her back which has been helpful.  She has not noticed any joint swelling.  She denies any joint stiffness.  There is no history of oral ulcers, nasal ulcers, malar rash, photosensitivity, Raynaud's phenomenon or lymphadenopathy.    Activities of Daily Living:  Patient reports morning stiffness for 0 minutes.   Patient Reports nocturnal pain.  Difficulty dressing/grooming: Denies Difficulty climbing stairs: Denies Difficulty getting out of chair: Denies Difficulty using hands for taps, buttons, cutlery, and/or writing: Denies  Review of Systems  Constitutional:  Negative for fatigue.  HENT:  Negative for mouth sores and mouth dryness.   Eyes:  Negative for dryness.  Respiratory:  Negative for shortness of breath.   Cardiovascular:  Negative for chest pain and palpitations.  Gastrointestinal:  Negative for blood in stool, constipation and diarrhea.  Endocrine: Negative for increased urination.  Genitourinary:  Negative for involuntary urination.  Musculoskeletal:  Negative for joint pain, gait problem, joint pain, joint swelling, myalgias, muscle weakness, morning stiffness, muscle tenderness and myalgias.  Skin:  Negative for color change, rash, hair loss and sensitivity to sunlight.  Allergic/Immunologic: Negative for susceptible to infections.  Neurological:  Negative for dizziness and headaches.  Hematological:  Negative for swollen glands.   Psychiatric/Behavioral:  Negative for depressed mood and sleep disturbance. The patient is not nervous/anxious.     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  Constitutional:      Appearance: She is well-developed.  HENT:     Head: Normocephalic and atraumatic.  Eyes:     Conjunctiva/sclera: Conjunctivae normal.  Cardiovascular:     Rate and Rhythm: Normal rate and regular rhythm.     Heart sounds: Normal heart sounds.  Pulmonary:  Effort: Pulmonary effort is normal.     Breath sounds: Normal breath sounds.  Abdominal:     General: Bowel sounds are normal.     Palpations: Abdomen is soft.  Musculoskeletal:     Cervical back: Normal range of motion.  Lymphadenopathy:     Cervical: No cervical adenopathy.  Skin:    General: Skin is warm and dry.     Capillary Refill: Capillary refill takes less than 2 seconds.  Neurological:     Mental Status: She is alert and oriented to person, place, and time.   Psychiatric:        Behavior: Behavior normal.      Musculoskeletal Exam: Cervical, thoracic and lumbar spine were in good range of motion.  She had some discomfort over the right lower lumbar region.  Shoulder joints, elbow joints, wrist joints, MCPs PIPs and DIPs in good range of motion with no synovitis.  Hip joints, knee joints were in good range of motion without any warmth swelling or effusion.  There was no tenderness over ankles or MTPs.  CDAI Exam: CDAI Score: -- Patient Global: --; Provider Global: -- Swollen: --; Tender: -- Joint Exam 12/18/2022   No joint exam has been documented for this visit   There is currently no information documented on the homunculus. Go to the Rheumatology activity and complete the homunculus joint exam.  Investigation: No additional findings.  Imaging: XR Hand 2 View Left  Result Date: 12/05/2022 No CMC, MCP, PIP, DIP, intercarpal or radiocarpal joint space narrowing was noted.  No erosive changes were noted. Impression: Unremarkable x-rays of the hand.  XR Hand 2 View Right  Result Date: 12/05/2022 No CMC, MCP, PIP, DIP, intercarpal or radiocarpal joint space narrowing was noted.  No erosive changes were noted. Impression: Unremarkable x-rays of the hand.  XR Lumbar Spine 2-3 Views  Result Date: 12/05/2022 No significant disc space narrowing was noted.  Mild anterior osteophytes were noted.  No facet joint arthropathy was noted.  No SI joint narrowing or sclerosis was noted. Impression: Unremarkable x-rays of the lumbar spine.   Recent Labs: Lab Results  Component Value Date   WBC 8.2 05/07/2022   HGB 14.0 05/07/2022   PLT 273 05/07/2022   NA 138 12/05/2022   K 4.3 12/05/2022   CL 104 12/05/2022   CO2 25 12/05/2022   GLUCOSE 86 12/05/2022   BUN 10 12/05/2022   CREATININE 0.60 12/05/2022   BILITOT 0.5 12/05/2022   ALKPHOS 123 (H) 05/07/2022   AST 20 12/05/2022   ALT 46 (H) 12/05/2022   PROT 6.8 12/05/2022   ALBUMIN 4.6  05/07/2022   CALCIUM 9.7 12/05/2022   GFRAA 141 11/17/2018   December 05, 2022 ESR 22, ANA negative, Smith negative, RNP negative, SSA negative, SSB negative, RF negative, anti-CCP negative, C3-C4 normal, vitamin D 75  Speciality Comments: No specialty comments available.  Procedures:  No procedures performed Allergies: Patient has no known allergies.   Assessment / Plan:     Visit Diagnoses: Positive ANA (antinuclear antibody)-patient was initially seen for positive ANA and arthralgias.  Repeat ANA is negative, ENA panel negative, complements normal.  Sedimentation rate is also close to normal.  I had a detailed discussion with the patient regarding the lab results.  She has no clinical features of autoimmune disease.  She denies any history of oral ulcers, nasal ulcers, malar rash, photosensitivity, Raynaud's, lymphadenopathy.  Arthralgia of both hands-she was having some discomfort in her hands which she describes in  the DIP joints.  No synovitis was noted on the examination today.  X-rays of the bilateral hands obtained at the last visit were unremarkable.  I advised her to contact me if she develops any swelling.  We may consider obtaining ultrasound of bilateral hands to look for synovitis.  A handout on hand exercises was given.  Chronic midline low back pain without sciatica-she complains of intermittent lower back pain.  The pain starts towards the end of the day.  She describes pain mostly in the right paravertebral region.  X-rays obtained at the last visit were unremarkable.  Her symptoms appear to be muscular in origin.  Stretching exercises were demonstrated in the office.  Vitamin D deficiency-she had vitamin D deficiency and took vitamin D 50,000 units for 1 month.  I advised her to stay on vitamin D 2000 units daily.  Hyperprolactinemia (HCC)  Menstrual periods irregular  Orders: No orders of the defined types were placed in this encounter.  No orders of the defined types  were placed in this encounter.    Follow-Up Instructions: Return if symptoms worsen or fail to improve, for Joint pain.   Pollyann Savoy, MD  Note - This record has been created using Animal nutritionist.  Chart creation errors have been sought, but may not always  have been located. Such creation errors do not reflect on  the standard of medical care.

## 2022-12-05 ENCOUNTER — Encounter: Payer: Self-pay | Admitting: Rheumatology

## 2022-12-05 ENCOUNTER — Ambulatory Visit: Payer: 59 | Attending: Rheumatology | Admitting: Rheumatology

## 2022-12-05 ENCOUNTER — Ambulatory Visit: Payer: 59

## 2022-12-05 VITALS — BP 124/89 | HR 79 | Resp 15 | Ht 63.0 in | Wt 171.0 lb

## 2022-12-05 DIAGNOSIS — M25541 Pain in joints of right hand: Secondary | ICD-10-CM

## 2022-12-05 DIAGNOSIS — G8929 Other chronic pain: Secondary | ICD-10-CM | POA: Diagnosis not present

## 2022-12-05 DIAGNOSIS — M545 Low back pain, unspecified: Secondary | ICD-10-CM

## 2022-12-05 DIAGNOSIS — R030 Elevated blood-pressure reading, without diagnosis of hypertension: Secondary | ICD-10-CM

## 2022-12-05 DIAGNOSIS — R768 Other specified abnormal immunological findings in serum: Secondary | ICD-10-CM

## 2022-12-05 DIAGNOSIS — E559 Vitamin D deficiency, unspecified: Secondary | ICD-10-CM | POA: Diagnosis not present

## 2022-12-05 DIAGNOSIS — M25542 Pain in joints of left hand: Secondary | ICD-10-CM | POA: Diagnosis not present

## 2022-12-05 DIAGNOSIS — E221 Hyperprolactinemia: Secondary | ICD-10-CM

## 2022-12-05 DIAGNOSIS — N926 Irregular menstruation, unspecified: Secondary | ICD-10-CM

## 2022-12-05 DIAGNOSIS — R011 Cardiac murmur, unspecified: Secondary | ICD-10-CM

## 2022-12-05 NOTE — Patient Instructions (Signed)
Low Back Sprain or Strain Rehab Ask your health care provider which exercises are safe for you. Do exercises exactly as told by your health care provider and adjust them as directed. It is normal to feel mild stretching, pulling, tightness, or discomfort as you do these exercises. Stop right away if you feel sudden pain or your pain gets worse. Do not begin these exercises until told by your health care provider. Stretching and range-of-motion exercises These exercises warm up your muscles and joints and improve the movement and flexibility of your back. These exercises also help to relieve pain, numbness, and tingling. Lumbar rotation  Lie on your back on a firm bed or the floor with your knees bent. Straighten your arms out to your sides so each arm forms a 90-degree angle (right angle) with a side of your body. Slowly move (rotate) both of your knees to one side of your body until you feel a stretch in your lower back (lumbar). Try not to let your shoulders lift off the floor. Hold this position for __________ seconds. Tense your abdominal muscles and slowly move your knees back to the starting position. Repeat this exercise on the other side of your body. Repeat __________ times. Complete this exercise __________ times a day. Single knee to chest  Lie on your back on a firm bed or the floor with both legs straight. Bend one of your knees. Use your hands to move your knee up toward your chest until you feel a gentle stretch in your lower back and buttock. Hold your leg in this position by holding on to the front of your knee. Keep your other leg as straight as possible. Hold this position for __________ seconds. Slowly return to the starting position. Repeat with your other leg. Repeat __________ times. Complete this exercise __________ times a day. Prone extension on elbows  Lie on your abdomen on a firm bed or the floor (prone position). Prop yourself up on your elbows. Use your arms  to help lift your chest up until you feel a gentle stretch in your abdomen and your lower back. This will place some of your body weight on your elbows. If this is uncomfortable, try stacking pillows under your chest. Your hips should stay down, against the surface that you are lying on. Keep your hip and back muscles relaxed. Hold this position for __________ seconds. Slowly relax your upper body and return to the starting position. Repeat __________ times. Complete this exercise __________ times a day. Strengthening exercises These exercises build strength and endurance in your back. Endurance is the ability to use your muscles for a long time, even after they get tired. Pelvic tilt This exercise strengthens the muscles that lie deep in the abdomen. Lie on your back on a firm bed or the floor with your legs extended. Bend your knees so they are pointing toward the ceiling and your feet are flat on the floor. Tighten your lower abdominal muscles to press your lower back against the floor. This motion will tilt your pelvis so your tailbone points up toward the ceiling instead of pointing to your feet or the floor. To help with this exercise, you may place a small towel under your lower back and try to push your back into the towel. Hold this position for __________ seconds. Let your muscles relax completely before you repeat this exercise. Repeat __________ times. Complete this exercise __________ times a day. Alternating arm and leg raises  Get on your hands  and knees on a firm surface. If you are on a hard floor, you may want to use padding, such as an exercise mat, to cushion your knees. Line up your arms and legs. Your hands should be directly below your shoulders, and your knees should be directly below your hips. Lift your left leg behind you. At the same time, raise your right arm and straighten it in front of you. Do not lift your leg higher than your hip. Do not lift your arm higher  than your shoulder. Keep your abdominal and back muscles tight. Keep your hips facing the ground. Do not arch your back. Keep your balance carefully, and do not hold your breath. Hold this position for __________ seconds. Slowly return to the starting position. Repeat with your right leg and your left arm. Repeat __________ times. Complete this exercise __________ times a day. Abdominal set with straight leg raise  Lie on your back on a firm bed or the floor. Bend one of your knees and keep your other leg straight. Tense your abdominal muscles and lift your straight leg up, 4-6 inches (10-15 cm) off the ground. Keep your abdominal muscles tight and hold this position for __________ seconds. Do not hold your breath. Do not arch your back. Keep it flat against the ground. Keep your abdominal muscles tense as you slowly lower your leg back to the starting position. Repeat with your other leg. Repeat __________ times. Complete this exercise __________ times a day. Single leg lower with bent knees Lie on your back on a firm bed or the floor. Tense your abdominal muscles and lift your feet off the floor, one foot at a time, so your knees and hips are bent in 90-degree angles (right angles). Your knees should be over your hips and your lower legs should be parallel to the floor. Keeping your abdominal muscles tense and your knee bent, slowly lower one of your legs so your toe touches the ground. Lift your leg back up to return to the starting position. Do not hold your breath. Do not let your back arch. Keep your back flat against the ground. Repeat with your other leg. Repeat __________ times. Complete this exercise __________ times a day. Posture and body mechanics Good posture and healthy body mechanics can help to relieve stress in your body's tissues and joints. Body mechanics refers to the movements and positions of your body while you do your daily activities. Posture is part of body  mechanics. Good posture means: Your spine is in its natural S-curve position (neutral). Your shoulders are pulled back slightly. Your head is not tipped forward (neutral). Follow these guidelines to improve your posture and body mechanics in your everyday activities. Standing  When standing, keep your spine neutral and your feet about hip-width apart. Keep a slight bend in your knees. Your ears, shoulders, and hips should line up. When you do a task in which you stand in one place for a long time, place one foot up on a stable object that is 2-4 inches (5-10 cm) high, such as a footstool. This helps keep your spine neutral. Sitting  When sitting, keep your spine neutral and keep your feet flat on the floor. Use a footrest, if necessary, and keep your thighs parallel to the floor. Avoid rounding your shoulders, and avoid tilting your head forward. When working at a desk or a computer, keep your desk at a height where your hands are slightly lower than your elbows. Slide your  chair under your desk so you are close enough to maintain good posture. When working at a computer, place your monitor at a height where you are looking straight ahead and you do not have to tilt your head forward or downward to look at the screen. Resting When lying down and resting, avoid positions that are most painful for you. If you have pain with activities such as sitting, bending, stooping, or squatting, lie in a position in which your body does not bend very much. For example, avoid curling up on your side with your arms and knees near your chest (fetal position). If you have pain with activities such as standing for a long time or reaching with your arms, lie with your spine in a neutral position and bend your knees slightly. Try the following positions: Lying on your side with a pillow between your knees. Lying on your back with a pillow under your knees. Lifting  When lifting objects, keep your feet at least  shoulder-width apart and tighten your abdominal muscles. Bend your knees and hips and keep your spine neutral. It is important to lift using the strength of your legs, not your back. Do not lock your knees straight out. Always ask for help to lift heavy or awkward objects. This information is not intended to replace advice given to you by your health care provider. Make sure you discuss any questions you have with your health care provider. Document Revised: 07/24/2020 Document Reviewed: 07/24/2020 [employer] Patient Education  2024 [employer] Hand Exercises Hand exercises can be helpful for almost anyone. They can strengthen your hands and improve flexibility and movement. The exercises can also increase blood flow to the hands. These results can make your work and daily tasks easier for you. Hand exercises can be especially helpful for people who have joint pain from arthritis or nerve damage from using their hands over and over. These exercises can also help people who injure a hand. Exercises Most of these hand exercises are gentle stretching and motion exercises. It is usually safe to do them often throughout the day. Warming up your hands before exercise may help reduce stiffness. You can do this with gentle massage or by placing your hands in warm water for 10-15 minutes. It is normal to feel some stretching, pulling, tightness, or mild discomfort when you begin new exercises. In time, this will improve. Remember to always be careful and stop right away if you feel sudden, very bad pain or your pain gets worse. You want to get better and be safe. Ask your health care provider which exercises are safe for you. Do exercises exactly as told by your provider and adjust them as told. Do not begin these exercises until told by your provider. Knuckle bend or "claw" fist  Stand or sit with your arm, hand, and all five fingers pointed straight up. Make sure to keep your wrist straight. Gently bend  your fingers down toward your palm until the tips of your fingers are touching your palm. Keep your big knuckle straight and only bend the small knuckles in your fingers. Hold this position for 10 seconds. Straighten your fingers back to your starting position. Repeat this exercise 5-10 times with each hand. Full finger fist  Stand or sit with your arm, hand, and all five fingers pointed straight up. Make sure to keep your wrist straight. Gently bend your fingers into your palm until the tips of your fingers are touching the middle of your palm.  Hold this position for 10 seconds. Extend your fingers back to your starting position, stretching every joint fully. Repeat this exercise 5-10 times with each hand. Straight fist  Stand or sit with your arm, hand, and all five fingers pointed straight up. Make sure to keep your wrist straight. Gently bend your fingers at the big knuckle, where your fingers meet your hand, and at the middle knuckle. Keep the knuckle at the tips of your fingers straight and try to touch the bottom of your palm. Hold this position for 10 seconds. Extend your fingers back to your starting position, stretching every joint fully. Repeat this exercise 5-10 times with each hand. Tabletop  Stand or sit with your arm, hand, and all five fingers pointed straight up. Make sure to keep your wrist straight. Gently bend your fingers at the big knuckle, where your fingers meet your hand, as far down as you can. Keep the small knuckles in your fingers straight. Think of forming a tabletop with your fingers. Hold this position for 10 seconds. Extend your fingers back to your starting position, stretching every joint fully. Repeat this exercise 5-10 times with each hand. Finger spread  Place your hand flat on a table with your palm facing down. Make sure your wrist stays straight. Spread your fingers and thumb apart from each other as far as you can until you feel a gentle stretch.  Hold this position for 10 seconds. Bring your fingers and thumb tight together again. Hold this position for 10 seconds. Repeat this exercise 5-10 times with each hand. Making circles  Stand or sit with your arm, hand, and all five fingers pointed straight up. Make sure to keep your wrist straight. Make a circle by touching the tip of your thumb to the tip of your index finger. Hold for 10 seconds. Then open your hand wide. Repeat this motion with your thumb and each of your fingers. Repeat this exercise 5-10 times with each hand. Thumb motion  Sit with your forearm resting on a table and your wrist straight. Your thumb should be facing up toward the ceiling. Keep your fingers relaxed as you move your thumb. Lift your thumb up as high as you can toward the ceiling. Hold for 10 seconds. Bend your thumb across your palm as far as you can, reaching the tip of your thumb for the small finger (pinkie) side of your palm. Hold for 10 seconds. Repeat this exercise 5-10 times with each hand. Grip strengthening  Hold a stress ball or other soft ball in the middle of your hand. Slowly increase the pressure, squeezing the ball as much as you can without causing pain. Think of bringing the tips of your fingers into the middle of your palm. All of your finger joints should bend when doing this exercise. Hold your squeeze for 10 seconds, then relax. Repeat this exercise 5-10 times with each hand. Contact a health care provider if: Your hand pain or discomfort gets much worse when you do an exercise. Your hand pain or discomfort does not improve within 2 hours after you exercise. If you have either of these problems, stop doing these exercises right away. Do not do them again unless your provider says that you can. Get help right away if: You develop sudden, severe hand pain or swelling. If this happens, stop doing these exercises right away. Do not do them again unless your provider says that you  can. This information is not intended to replace advice given  to you by your health care provider. Make sure you discuss any questions you have with your health care provider. Document Revised: 05/21/2022 Document Reviewed: 05/21/2022 Else[employer] Patient Education  2024 [employer].

## 2022-12-06 LAB — SEDIMENTATION RATE: Sed Rate: 22 mm/h — ABNORMAL HIGH (ref 0–20)

## 2022-12-06 LAB — COMPLETE METABOLIC PANEL WITH GFR
Alkaline phosphatase (APISO): 85 U/L (ref 31–125)
BUN: 10 mg/dL (ref 7–25)
CO2: 25 mmol/L (ref 20–32)
Calcium: 9.7 mg/dL (ref 8.6–10.2)
Sodium: 138 mmol/L (ref 135–146)
Total Protein: 6.8 g/dL (ref 6.1–8.1)
eGFR: 121 mL/min/{1.73_m2} (ref >=60)

## 2022-12-06 LAB — RHEUMATOID FACTOR: Rheumatoid fact SerPl-aCnc: <10 IU/mL (ref <14)

## 2022-12-06 LAB — RNP ANTIBODY: Ribonucleic Protein(ENA) Antibody, IgG: <1 AI

## 2022-12-06 LAB — VITAMIN D 25 HYDROXY (VIT D DEFICIENCY, FRACTURES): Vit D, 25-Hydroxy: 75 ng/mL (ref 30–100)

## 2022-12-07 LAB — C3 AND C4
C3 Complement: 192 mg/dL (ref 83–193)
C4 Complement: 36 mg/dL (ref 15–57)

## 2022-12-07 LAB — ANTI-SMITH ANTIBODY: ENA SM Ab Ser-aCnc: <1 AI

## 2022-12-07 LAB — COMPLETE METABOLIC PANEL WITH GFR
AG Ratio: 1.8 (calc) (ref 1.0–2.5)
ALT: 46 U/L — ABNORMAL HIGH (ref 6–29)
AST: 20 U/L (ref 10–30)
Albumin: 4.4 g/dL (ref 3.6–5.1)
Chloride: 104 mmol/L (ref 98–110)
Creat: 0.6 mg/dL (ref 0.50–0.97)
Globulin: 2.4 g/dL (calc) (ref 1.9–3.7)
Glucose, Bld: 86 mg/dL (ref 65–99)
Potassium: 4.3 mmol/L (ref 3.5–5.3)
Total Bilirubin: 0.5 mg/dL (ref 0.2–1.2)

## 2022-12-07 LAB — CYCLIC CITRUL PEPTIDE ANTIBODY, IGG: Cyclic Citrullin Peptide Ab: <16 UNITS

## 2022-12-07 LAB — SJOGRENS SYNDROME-B EXTRACTABLE NUCLEAR ANTIBODY: SSB (La) (ENA) Antibody, IgG: <1 AI

## 2022-12-07 LAB — SJOGRENS SYNDROME-A EXTRACTABLE NUCLEAR ANTIBODY: SSA (Ro) (ENA) Antibody, IgG: <1 AI

## 2022-12-07 LAB — ANA: Anti Nuclear Antibody (ANA): NEGATIVE

## 2022-12-07 NOTE — Progress Notes (Signed)
I will discuss results at the follow-up visit.

## 2022-12-18 ENCOUNTER — Encounter: Payer: Self-pay | Admitting: Rheumatology

## 2022-12-18 ENCOUNTER — Ambulatory Visit: Payer: 59 | Attending: Rheumatology | Admitting: Rheumatology

## 2022-12-18 VITALS — BP 129/88 | HR 72 | Resp 16 | Ht 63.0 in | Wt 172.6 lb

## 2022-12-18 DIAGNOSIS — E559 Vitamin D deficiency, unspecified: Secondary | ICD-10-CM

## 2022-12-18 DIAGNOSIS — M545 Low back pain, unspecified: Secondary | ICD-10-CM | POA: Diagnosis not present

## 2022-12-18 DIAGNOSIS — R768 Other specified abnormal immunological findings in serum: Secondary | ICD-10-CM

## 2022-12-18 DIAGNOSIS — M25541 Pain in joints of right hand: Secondary | ICD-10-CM

## 2022-12-18 DIAGNOSIS — G8929 Other chronic pain: Secondary | ICD-10-CM

## 2022-12-18 DIAGNOSIS — M25542 Pain in joints of left hand: Secondary | ICD-10-CM

## 2022-12-18 DIAGNOSIS — E221 Hyperprolactinemia: Secondary | ICD-10-CM

## 2022-12-18 DIAGNOSIS — N926 Irregular menstruation, unspecified: Secondary | ICD-10-CM

## 2022-12-18 DIAGNOSIS — R7689 Other specified abnormal immunological findings in serum: Secondary | ICD-10-CM

## 2022-12-18 NOTE — Patient Instructions (Signed)

## 2022-12-25 ENCOUNTER — Encounter: Payer: Self-pay | Admitting: Physician Assistant

## 2022-12-25 ENCOUNTER — Ambulatory Visit: Payer: 59 | Admitting: Physician Assistant

## 2022-12-25 VITALS — BP 134/86 | HR 74 | Ht 63.0 in | Wt 171.6 lb

## 2022-12-25 DIAGNOSIS — R0789 Other chest pain: Secondary | ICD-10-CM

## 2022-12-25 NOTE — Patient Instructions (Signed)
Medication Instructions:  Your physician recommends that you continue on your current medications as directed. Please refer to the Current Medication list given to you today.  *If you need a refill on your cardiac medications before your next appointment, please call your pharmacy*   Lab Work: None  If you have labs (blood work) drawn today and your tests are completely normal, you will receive your results only by: MyChart Message (if you have MyChart) OR A paper copy in the mail If you have any lab test that is abnormal or we need to change your treatment, we will call you to review the results.   Testing/Procedures: None   Follow-Up: At [facility], you and your health needs are our priority.  As part of our continuing mission to provide you with exceptional heart care, we have created designated Provider Care Teams.  These Care Teams include your primary Cardiologist (physician) and Advanced Practice Providers (APPs -  Physician Assistants and Nurse Practitioners) who all work together to provide you with the care you need, when you need it.  We recommend signing up for the patient portal called "MyChart".  Sign up information is provided on this After Visit Summary.  MyChart is used to connect with patients for Virtual Visits (Telemedicine).  Patients are able to view lab/test results, encounter notes, upcoming appointments, etc.  Non-urgent messages can be sent to your provider as well.   To learn more about what you can do with MyChart, go to [email or web address].    Your next appointment:   As needed  Other Instructions Please call our office if you continue to experience chest pain during exercise.

## 2022-12-25 NOTE — Progress Notes (Signed)
Cardiology Office Note:  .   Date:  12/25/2022  ID:  Tiffany Guerrero, DOB Jan 12, 1986, MRN [id number] PCP: Arnette Felts, FNP  [facility] Providers Cardiologist:  None    History of Present Illness: Marland Kitchen   Tiffany Guerrero is a 37 y.o. female was past medical history of PVCs and heart murmur.  Previous echocardiogram obtained on 02/04/2019 showed EF 45 to 50%, moderate apical hypokinesis, no significant valve issue.  The image was reviewed by Dr. Flora Lipps who felt that her echo was normal, he disagreed with the mild reduction in the ejection fraction.  EF appears to be mildly reduced because of PVCs.  No additional testing was felt to be needed.  She was instructed to follow-up only as needed basis.  She has been followed by rheumatology service for positive ANA and joint discomfort.  Repeat ANA has been negative.  She has no clinical features of autoimmune disease.  Patient presents today for follow-up and evaluation of solitary episode of chest discomfort.  This happened 5 days ago on Saturday.  She was sitting down when she noticed the left arm numbness and either chest pressure versus more awareness of her heartbeat.  The total duration lasted roughly 10 minutes before going away.  It has not recurred since.  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  She says she has been able to walk from the parking lot to her workplace without any exertional symptoms.  Her EKG is normal.  I discussed her case with Dr. Flora Lipps, her primary cardiologist, we want a hold off on additional testing given her young age.  She has been instructed to continue observe her symptoms and to do more strenuous activity.  If her symptoms recurs when she does more strenuous activity, she has been instructed to contact cardiology service and we can consider additional testing.  Otherwise, I would prefer to hold off on ordering a coronary CT in this very young patient to avoid exposing her to  radiation.  ROS:   She denies palpitations, dyspnea, pnd, orthopnea, n, v, dizziness, syncope, edema, weight gain, or early satiety. All other systems reviewed and are otherwise negative except as noted above.    Studies Reviewed: Marland Kitchen   EKG Interpretation Date/Time:  Wednesday December 25 2022 08:59:12 EDT Ventricular Rate:  74 PR Interval:  146 QRS Duration:  70 QT Interval:  408 QTC Calculation: 452 R Axis:   38  Text Interpretation: Normal sinus rhythm Normal ECG Confirmed by Azalee Course [phone]) on 12/25/2022 9:54:57 AM    Cardiac Studies & Procedures       ECHOCARDIOGRAM  ECHOCARDIOGRAM COMPLETE 02/04/2019  Narrative ECHOCARDIOGRAM REPORT    Patient Name:   Tiffany Guerrero Date of Exam: 02/04/2019 Medical Rec #:  [id number]              Height:       64.0 in Accession #:    [id number]             Weight:       159.6 lb Date of Birth:  03-Mar-1986              BSA:          1.78 m Patient Age:    32 years               BP:           108/77 mmHg Patient Gender: F  HR:           43 bpm. Exam Location:  [address]  Procedure: 2D Echo, Cardiac Doppler, Color Doppler and Intracardiac Opacification Agent  Indications:    R01.1 Murmur  History:        Patient has no prior history of Echocardiogram examinations. Vertigo. Abdominal pain.  Sonographer:    Cathie Beams RCS Referring Phys: [id number] Ronnald Ramp O'NEAL  IMPRESSIONS   1. Left ventricular ejection fraction, by visual estimation, is 45 to 50%. The left ventricle has normal function. Left ventricular septal wall thickness was normal. Normal left ventricular posterior wall thickness. There is no left ventricular hypertrophy. 2. The apex is moderately hypokinetic. 3. Global right ventricle has normal systolic function.The right ventricular size is normal. No increase in right ventricular wall thickness. 4. Left atrial size was normal. 5. Right atrial size was normal. 6. The mitral valve  is normal in structure. No evidence of mitral valve regurgitation. 7. The tricuspid valve is normal in structure. Tricuspid valve regurgitation was not visualized by color flow Doppler. 8. The aortic valve The aortic valve is normal in structure. Aortic valve regurgitation was not visualized by color flow Doppler. 9. The pulmonic valve was normal in structure. Pulmonic valve regurgitation is not visualized by color flow Doppler. 10. The atrial septum is grossly normal.  FINDINGS Left Ventricle: Left ventricular ejection fraction, by visual estimation, is 45 to 50%. The left ventricle has normal function. Left ventricular septal wall thickness was normal. Normal left ventricular posterior wall thickness. There is no left ventricular hypertrophy. Spectral Doppler shows Left ventricular diastolic parameters were normal pattern of LV diastolic filling. The apex is moderately hypokinetic.  Right Ventricle: The right ventricular size is normal. No increase in right ventricular wall thickness. Global RV systolic function is has normal systolic function.  Left Atrium: Left atrial size was normal in size.  Right Atrium: Right atrial size was normal in size  Pericardium: There is no evidence of pericardial effusion.  Mitral Valve: The mitral valve is normal in structure. No evidence of mitral valve regurgitation.  Tricuspid Valve: The tricuspid valve is normal in structure. Tricuspid valve regurgitation was not visualized by color flow Doppler.  Aortic Valve: The aortic valve The aortic valve is normal in structure. Aortic valve regurgitation was not visualized by color flow Doppler.  Pulmonic Valve: The pulmonic valve was normal in structure. Pulmonic valve regurgitation is not visualized by color flow Doppler.  Aorta: The aortic root is normal in size and structure.  IAS/Shunts: The atrial septum is grossly normal.    LEFT VENTRICLE          Normals PLAX 2D LVIDd:         4.00 cm  3.6 cm    Diastology                  Normals LVIDs:         2.20 cm  1.7 cm   LV e' lateral:   15.57 cm/s 6.42 cm/s LV PW:         1.00 cm  1.4 cm   LV E/e' lateral: 8.0        15.4 LV IVS:        0.80 cm  1.3 cm   LV e' medial:    14.57 cm/s 6.96 cm/s LVOT diam:     1.80 cm  2.0 cm   LV E/e' medial:  8.5        6.96 LV  SV:         54 ml    79 ml LV SV Index:   29.45    45 ml/m2 LVOT Area:     2.54 cm 3.14 cm2   RIGHT VENTRICLE RV Basal diam:  2.34 cm RV S prime:     13.40 cm/s TAPSE (M-mode): 2.2 cm  LEFT ATRIUM             Index       RIGHT ATRIUM           Index LA diam:        2.80 cm 1.58 cm/m  RA Area:     12.30 cm LA Vol (A2C):   48.0 ml 27.00 ml/m RA Volume:   25.80 ml  14.52 ml/m LA Vol (A4C):   50.2 ml 28.24 ml/m LA Biplane Vol: 50.3 ml 28.30 ml/m AORTIC VALVE             Normals LVOT Vmax:   103.00 cm/s LVOT Vmean:  67.900 cm/s 75 cm/s LVOT VTI:    0.214 m     25.3 cm  AORTA                 Normals Ao Root diam: 2.30 cm 31 mm  MITRAL VALVE              Normals MV Area (PHT): 4.65 cm              SHUNTS MV PHT:        47.27 msec 55 ms      Systemic VTI:  0.21 m MV Decel Time: 163 msec   187 ms     Systemic Diam: 1.80 cm MV E velocity: 124.00 cm/s 103 cm/s MV A velocity: 71.83 cm/s  70.3 cm/s MV E/A ratio:  1.73        1.5   Kristeen Miss MD Electronically signed by Kristeen Miss MD Signature Date/Time: 02/04/2019/6:38:44 PM    Final             Risk Assessment/Calculations:             Physical Exam:   VS:  BP 134/86 (BP Location: Right Arm, Patient Position: Sitting, Cuff Size: Normal)   Pulse 74   Ht 5\' 3"  (1.6 m)   Wt 171 lb 9.6 oz (77.8 kg)   SpO2 97%   BMI 30.40 kg/m    Wt Readings from Last 3 Encounters:  12/25/22 171 lb 9.6 oz (77.8 kg)  12/18/22 172 lb 9.6 oz (78.3 kg)  12/05/22 171 lb (77.6 kg)    GEN: Well nourished, well developed in no acute distress NECK: No JVD; No carotid bruits CARDIAC: RRR, no murmurs, rubs,  gallops RESPIRATORY:  Clear to auscultation without rales, wheezing or rhonchi  ABDOMEN: Soft, non-tender, non-distended EXTREMITIES:  No edema; No deformity   ASSESSMENT AND PLAN: .    Chest discomfort: Solitary episode that occurred last Saturday.  Accompanied by left arm numbness.  She is not sure if she had chest pressure versus more cardiac awareness of the heartbeat.  EKG today is normal.  She does not have any focal neurological deficit.  She has been able to walk from the parking lot to where she work without any exertional symptoms.  Otherwise she does not do much strenuous activity.  I discussed her case with Dr. Flora Lipps her primary cardiologist who recommended no additional workup for now and to continue observation.  Dispo: PRN, call back if has recurrent chest discomfort  Signed, Azalee Course, PA

## 2022-12-31 ENCOUNTER — Encounter: Payer: Self-pay | Admitting: Internal Medicine

## 2022-12-31 ENCOUNTER — Ambulatory Visit: Payer: 59 | Admitting: Internal Medicine

## 2022-12-31 VITALS — BP 118/70 | HR 73 | Ht 63.0 in | Wt 172.6 lb

## 2022-12-31 DIAGNOSIS — E559 Vitamin D deficiency, unspecified: Secondary | ICD-10-CM | POA: Diagnosis not present

## 2022-12-31 DIAGNOSIS — E282 Polycystic ovarian syndrome: Secondary | ICD-10-CM

## 2022-12-31 DIAGNOSIS — E221 Hyperprolactinemia: Secondary | ICD-10-CM | POA: Diagnosis not present

## 2022-12-31 LAB — POCT GLYCOSYLATED HEMOGLOBIN (HGB A1C): Hemoglobin A1C: 5.6 % (ref 4.0–5.6)

## 2022-12-31 NOTE — Addendum Note (Signed)
Addended by: Pollie Meyer on: 12/31/2022 08:25 AM   Modules accepted: Orders

## 2022-12-31 NOTE — Progress Notes (Signed)
Patient ID: Tiffany Guerrero, female   DOB: 1985/12/13, 37 y.o.   MRN: [id number]  HPI  Tiffany Guerrero is a 37 y.o.-year-old pleasant female, initially referred by her PCP, Dr. Alvy Bimler, returning for follow-up for irregular menstrual cycles and hyperprolactinemia.  Last visit 1 year ago.  Interim history: She continues to have occasional headaches. She continues on Provera by OB/GYN with ~4 withdrawal bleeds She also has acne, increased before last visit. Now, stabilized. She saw Dr. Corliss Skains since last visit, after an ANA returned elevated.  Autoimmune conditions were ruled out.  Reviewed and addended history: Pt. has been found to have a high prolactin level in 03/2021 during investigation for irregular menstrual cycles.  At that time, she presented with: -Irregular menstrual cycles -but she "always" had these, since menarche at 37 years old: skipping 3-4 mo (ave 3-4 cycles a year). She has spotting for up to 1 week before the cycles. -Hirsutism -Weight gain/inability to lose weight  But no: -Galactorrhea  Patient prolactin levels were reviewed Lab Results  Component Value Date   PROLACTIN 14.2 12/20/2021   PROLACTIN 10.1 06/21/2021   PROLACTIN 29.7 (H) 04/06/2021   PROLACTIN 23.4 (H) 04/03/2021   Patient's thyroid tests were also reviewed and these were normal: Lab Results  Component Value Date   TSH 1.720 02/22/2021   TSH 1.720 12/18/2016   FREET4 1.36 02/22/2021    Of note, her testosterone, FSH, and DHEA-S levels were normal: Component     Latest Ref Rng & Units 04/03/2021  Testosterone     8 - 60 ng/dL 16  Testosterone Free     0.0 - 4.2 pg/mL 1.0  FSH     mIU/mL 7.0  DHEA-SO4     84.8 - 378.0 ug/dL 829.5   Dexamethasone suppression test was normal: Component     Latest Ref Rng & Units 06/25/2021  Cortisol - AM     mcg/dL 0.8 (L)   Prolactin, Androstenedione, and 17 hydroxyprogesterone levels were also normal: Component     Latest Ref Rng &  Units 06/21/2021  Prolactin     ng/mL 10.1  17-OH-Progesterone, LC/MS/MS      ng/dL 70  ANDROSTENEDIONE     ng/dL 621   She is not on Risperdal, oral contraceptives, Reglan.  She had a transvaginal ultrasound (05/28/2021): Prominence of endometrial stripe without increased vascularity most likely is due to secretory phase of menstrual cycle.   There are subcentimeter follicles in the periphery of both ovaries which may be normal variation or suggest polycystic ovary disease.  Pt. also had vitamin D deficiency - prev. on ergocalciferol, but ran out -started back 12/2021, then ran out and now 2000 units daily.  Reviewed vitamin D levels: Lab Results  Component Value Date   VD25OH 75 12/05/2022   VD25OH 24.3 (L) 05/07/2022   VD25OH 12.14 (L) 12/20/2021   VD25OH 10.6 (L) 02/22/2021  No consistent exercise. She works on Production designer, theatre/television/film.Patient ID: Tiffany Guerrero, female   DOB: 1986/01/30, 37 y.o.   MRN: [id number]  HPI  Tiffany Guerrero is a 37 y.o.-year-old female, initially referred by her PCP, Dr. Alvy Bimler, returning for follow-up for irregular menstrual cycles and hyperprolactinemia.  Interim history: She has occasional HAs, related to the weather. She had spotting x2 mo - with 1 week break. Her ObGyn provider gave her Provera >> a normal cycle afterwards, now due for another cycle. She also noticed more acne recently.  Reviewed and addended history: Pt. has been found to have a high prolactin  level in 03/2021 during investigation for irregular menstrual cycles.  At that time, she presented with: -Irregular menstrual cycles -but she "always" had these, since menarche at 37 years old: skipping 3-4 mo (ave 3-4 cycles a year). She has spotting for up to 1 week before the cycles. -Hirsutism -Weight gain/inability to lose weight  But no: -Galactorrhea  Patient prolactin levels were reviewed: Lab Results  Component Value Date   PROLACTIN 14.2 12/20/2021   PROLACTIN  10.1 06/21/2021   PROLACTIN 29.7 (H) 04/06/2021   PROLACTIN 23.4 (H) 04/03/2021   Patient's thyroid tests were also reviewed and these were normal: Lab Results  Component Value Date   TSH 1.720 02/22/2021   TSH 1.720 12/18/2016   FREET4 1.36 02/22/2021    Of note, her testosterone, FSH, and DHEA-S levels were normal: Component     Latest Ref Rng & Units 04/03/2021  Testosterone     8 - 60 ng/dL 16  Testosterone Free     0.0 - 4.2 pg/mL 1.0  FSH     mIU/mL 7.0  DHEA-SO4     84.8 - 378.0 ug/dL 469.6   Dexamethasone suppression test was normal: Component     Latest Ref Rng & Units 06/25/2021  Cortisol - AM     mcg/dL 0.8 (L)   Prolactin, Androstenedione, and 17 hydroxyprogesterone levels were also normal: Component     Latest Ref Rng & Units 06/21/2021  Prolactin     ng/mL 10.1  17-OH-Progesterone, LC/MS/MS      ng/dL 70  ANDROSTENEDIONE     ng/dL 295   She is not on Risperdal, oral contraceptives, Reglan.  She had a transvaginal ultrasound (05/28/2021): Prominence of endometrial stripe without increased vascularity most likely is due to secretory phase of menstrual cycle.   There are subcentimeter follicles in the periphery of both ovaries which may be normal variation or suggest polycystic ovary disease.  Pt. also had vitamin D deficiency - prev. on ergocalciferol, but ran out: Lab Results  Component Value Date   VD25OH 75 12/05/2022   VD25OH 24.3 (L) 05/07/2022   VD25OH 12.14 (L) 12/20/2021   VD25OH 10.6 (L) 02/22/2021  No consistent exercise. 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   No possibility of pregnancy for now.  Last HbA1c was normal. Lab Results  Component Value Date   HGBA1C 5.4 12/20/2021   ROS: Constitutional: + See HPI  Past Medical History:  Diagnosis Date   Heart murmur 2020   Past Surgical History:  Procedure Laterality Date   WISDOM TOOTH EXTRACTION     Social History   Socioeconomic History   Marital status:  Married    Spouse name: Not on file   Number of children: 0   Years of education: Not on file   Highest education level: Not on file  Occupational History   Occupation: Engineer/analyst  Tobacco Use   Smoking status: Never    Passive exposure: Never   Smokeless tobacco: Never  Vaping Use   Vaping status: Never Used  Substance and Sexual Activity   Alcohol use: Yes    Comment: 1-2 wine glasses per month   Drug use: Never   Sexual activity: Yes    Birth control/protection: None    Comment: same sex relationship  Other Topics Concern   Not on file  Social History Narrative   Art gallery manager with [employer]    Social Determinants of Health   Financial Resource Strain: Not on [employer]  Insecurity: Not on file  Transportation Needs: Not on file  Physical Activity: Not on file  Stress: Not on file  Social Connections: Not on file  Intimate Partner Violence: Not on file   Current Outpatient Medications on File Prior to Visit  Medication Sig Dispense Refill   Vitamin D, Ergocalciferol, (DRISDOL) 1.25 MG (50000 UNIT) CAPS capsule TAKE 1 CAPSULE BY MOUTH TWICE A WEEK ON TUESDAY AND THRUSDAY 24 capsule 1   medroxyPROGESTERone (PROVERA) 5 MG tablet Take 1 tablet (5 mg total) by mouth daily. Use for 7 days in a row, period should come 3-5 days from stopping (Patient not taking: Reported on 12/31/2022) 28 tablet 1   No current facility-administered medications on file prior to visit.   No Known Allergies Family History  Problem Relation Age of Onset   Idiopathic pulmonary fibrosis Mother    Diabetes Father    Diabetes Brother    Cancer Paternal Grandfather    Alzheimer's disease Paternal Grandfather    PE: BP 118/70   Pulse 73   Ht 5\' 3"  (1.6 m)   Wt 172 lb 9.6 oz (78.3 kg)   SpO2 99%   BMI 30.57 kg/m  Wt Readings from Last 3 Encounters:  12/31/22 172 lb 9.6 oz (78.3 kg)  12/25/22 171 lb 9.6 oz (77.8 kg)  12/18/22 172 lb 9.6 oz (78.3 kg)   Constitutional: overweight  - truncal obesity, in NAD, + increased interscapular fat pad (buffalo hump) Eyes: EOMI, no exophthalmos ENT: no thyromegaly, no cervical lymphadenopathy Cardiovascular: RRR, No MRG Respiratory: CTA B Musculoskeletal: no deformities Skin: + pink stretch marks on anterior abdomen; acne spots on chin and sideburns., + Mild dark terminal hair on chin and sideburns, + acanthosis nigricans on neck Neurological: no tremor with outstretched hands  ASSESSMENT: 1.  Hyperprolactinemia  2.  Irregular menstrual cycles  3.  Buffalo hump  4. Vit D deficiency  PLAN: Patient with history of slightly high prolactin level discovered during investigation for irregular menstrual cycles. -We previously reviewed possible etiology for the higher prolactin levels: Pituitary microadenoma secreting prolactin, but also: Pregnancy Hypothyroidism -she recently had normal tests Stress  Exercise  Lack of sleep  Medications (nopsychotropic medications or Reglan) Drugs (denies) Chest wall lesions (denies) Seizures (denies) Liver ds (no history of ~) Kidney disease (no history of ~) A teratoma containing pituitary cells that can develop into prolactinoma (very rarely) Macroprolactin (multimeric prolactin, especially in patients without galactorrhea) Idiopathic -She did have a slightly high prolactin but not enough to impact her menstrual cycles, which are mostly affected by a prolactin level higher than 30 -At last 2 visits, prolactin levels were normal -No further investigations needed.  2.  Mild PCOS -Patient had irregular menstrual cycles for her entire life -Testosterone and DHEA-S levels were normal -She had a transvaginal ultrasound before our last visit, showing peripheral small ovarian cysts, possibly a normal variation or due to PCOS -We previously checked an androstenedione and 17 hydroxyprogesterone level and they were normal, making CAH unlikely.  As she also has irregular menstrual cycles and  hirsutism, our working diagnosis is mild PCOS.  She also had a slightly high prolactin level, which can be seen in PCOS. -We discussed about options for treatment for her irregular menstrual cycles: OCPs, Provera, metformin.  At last visit she was on Provera every 3 months per OB/GYN.  She continues on this with intermittently.  She last took it in 04/2022.  She had 3 episodes of withdrawal bleed since then. We  discussed that ideally she would have at least 4 menstrual cycles a year to prevent endometrial hyperplasia.  I advised her that if 3.5 months past and she does not have a cycle, to take Provera. -At today's visit, checked an HbA1c level as we discussed that PCOS can predispose her for prediabetes/diabetes >> this returns normal, at 5.6%, only slightly higher than before.  3.  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  However, this returns normal, with an appropriately suppressed cortisol, ruling out Cushing syndrome. -No further investigation is needed for this  4. Vitamin D deficiency -Patient had a very low vitamin D level in 02/2021, at 10.6. -She was on ergocalciferol but ran out few months prior to our last appointment -At our last visit, vitamin D was still quite low, at 12.14 -At that time, I recommended to start 5000 units vitamin D3 daily and have level rechecked in 2 to 3 months by OB/GYN or PCP.  She ended up restarting ergocalciferol 50,000 units weekly.  She ran out around 10/2022. -Latest vitamin D level was normal in 11/2022 and she was advised to start 2000 units vitamin D3 daily by rheumatology.  She continues on this dose.  Carlus Pavlov, MD PhD [facility]  Endocrinology

## 2022-12-31 NOTE — Patient Instructions (Addendum)
Please return in 1 year. 

## 2023-02-04 NOTE — Progress Notes (Signed)
Madelaine Bhat, CMA,acting as a Neurosurgeon for Arnette Felts, FNP.,have documented all relevant documentation on the behalf of Arnette Felts, FNP,as directed by  Arnette Felts, FNP while in the presence of Arnette Felts, FNP.  Subjective:  Patient ID: Tiffany Guerrero , female    DOB: Dec 19, 1985 , 37 y.o.   MRN: [id number]  Chief Complaint  Patient presents with   Rash    HPI  Patient presents today for a rash that appeared on her stomach. Patient reports it first started out as a bug bite but has spread. She no longer thinks it was a bug bite. Patient reports she first noticed 10 days ago, she has used OTC creams. Patient reports it kinda feels like a shock.  BP Readings from Last 3 Encounters: 02/06/23 : 130/80 12/31/22 : 118/70 12/25/22 : 134/86       Past Medical History:  Diagnosis Date   Heart murmur 2020     Family History  Problem Relation Age of Onset   Idiopathic pulmonary fibrosis Mother    Diabetes Father    Diabetes Brother    Cancer Paternal Grandfather    Alzheimer's disease Paternal Grandfather      Current Outpatient Medications:    triamcinolone ointment (KENALOG) 0.5 %, Apply 1 Application topically 2 (two) times daily., Disp: 30 g, Rfl: 0   Vitamin D, Ergocalciferol, (DRISDOL) 1.25 MG (50000 UNIT) CAPS capsule, TAKE 1 CAPSULE BY MOUTH TWICE A WEEK ON TUESDAY AND THRUSDAY, Disp: 24 capsule, Rfl: 1   No Known Allergies   Review of Systems  Constitutional: Negative.   HENT: Negative.    Eyes: Negative.   Respiratory: Negative.    Cardiovascular: Negative.   Gastrointestinal: Negative.   Neurological: Negative.   Psychiatric/Behavioral: Negative.       Today's Vitals   02/06/23 1600  BP: 130/80  Pulse: 85  Temp: 99.8 F (37.7 C)  TempSrc: Oral  Weight: 167 lb (75.8 kg)  Height: 5\' 3"  (1.6 m)  PainSc: 0-No pain   Body mass index is 29.58 kg/m.  Wt Readings from Last 3 Encounters:  02/06/23 167 lb (75.8 kg)  12/31/22 172 lb 9.6 oz  (78.3 kg)  12/25/22 171 lb 9.6 oz (77.8 kg)      Objective:  Physical Exam Vitals reviewed.  Constitutional:      General: She is not in acute distress.    Appearance: Normal appearance.  Pulmonary:     Effort: Pulmonary effort is normal. No respiratory distress.  Skin:    General: Skin is warm and dry.     Findings: Rash (fine red raised rash) present.  Neurological:     General: No focal deficit present.     Mental Status: She is alert and oriented to person, place, and time.  Psychiatric:        Mood and Affect: Mood normal.        Behavior: Behavior normal.        Thought Content: Thought content normal.        Judgment: Judgment normal.         Assessment And Plan:  Rash Assessment & Plan: Slightly raised fine rash with cluster appearance, will treat for possible HSV rash. Also will treat with steroid cream for itching.   Orders: -     valACYclovir HCl; Take 1 tablet (500 mg total) by mouth 2 (two) times daily for 5 days.  Dispense: 10 tablet; Refill: 0 -     Triamcinolone Acetonide; Apply 1 Application  topically 2 (two) times daily.  Dispense: 30 g; Refill: 0  Influenza vaccination declined Assessment & Plan: Patient declined influenza vaccination at this time. Patient is aware that influenza vaccine prevents illness in 70% of healthy people, and reduces hospitalizations to 30-70% in elderly. This vaccine is recommended annually. Education has been provided regarding the importance of this vaccine but patient still declined. Advised may receive this vaccine at local pharmacy or Health Dept.or vaccine clinic. Aware to provide a copy of the vaccination record if obtained from local pharmacy or Health Dept.  Pt is willing to accept risk associated with refusing vaccination.    BMI 29.0-29.9,adult    Return for keep same next appt..   Patient was given opportunity to ask questions. Patient verbalized understanding of the plan and was able to repeat key elements of  the plan. All questions were answered to their satisfaction.    Jeanell Sparrow, FNP, have reviewed all documentation for this visit. The documentation on 02/06/23 for the exam, diagnosis, procedures, and orders are all accurate and complete.   IF YOU HAVE BEEN REFERRED TO A SPECIALIST, IT MAY TAKE 1-2 WEEKS TO SCHEDULE/PROCESS THE REFERRAL. IF YOU HAVE NOT HEARD FROM US/SPECIALIST IN TWO WEEKS, PLEASE GIVE [place] A CALL AT [phone].

## 2023-02-06 ENCOUNTER — Ambulatory Visit: Payer: 59 | Admitting: Nurse Practitioner

## 2023-02-06 ENCOUNTER — Encounter: Payer: Self-pay | Admitting: Nurse Practitioner

## 2023-02-06 VITALS — BP 130/80 | HR 85 | Temp 99.8°F | Ht 63.0 in | Wt 167.0 lb

## 2023-02-06 DIAGNOSIS — R21 Rash and other nonspecific skin eruption: Secondary | ICD-10-CM | POA: Diagnosis not present

## 2023-02-06 DIAGNOSIS — Z6829 Body mass index (BMI) 29.0-29.9, adult: Secondary | ICD-10-CM

## 2023-02-06 DIAGNOSIS — Z2821 Immunization not carried out because of patient refusal: Secondary | ICD-10-CM

## 2023-02-06 MED ORDER — VALACYCLOVIR HCL 500 MG PO TABS
500.0000 mg | ORAL_TABLET | Freq: Two times a day (BID) | ORAL | 0 refills | Status: AC
Start: 2023-02-06 — End: 2023-02-11

## 2023-02-06 MED ORDER — TRIAMCINOLONE ACETONIDE 0.5 % EX OINT
1.0000 | TOPICAL_OINTMENT | Freq: Two times a day (BID) | CUTANEOUS | 0 refills | Status: DC
Start: 2023-02-06 — End: 2023-02-25

## 2023-02-19 DIAGNOSIS — R21 Rash and other nonspecific skin eruption: Secondary | ICD-10-CM | POA: Insufficient documentation

## 2023-02-19 DIAGNOSIS — Z2821 Immunization not carried out because of patient refusal: Secondary | ICD-10-CM | POA: Insufficient documentation

## 2023-02-19 NOTE — Assessment & Plan Note (Signed)
Slightly raised fine rash with cluster appearance, will treat for possible HSV rash. Also will treat with steroid cream for itching.

## 2023-02-19 NOTE — Assessment & Plan Note (Signed)

## 2023-02-25 ENCOUNTER — Other Ambulatory Visit: Payer: Self-pay | Admitting: Nurse Practitioner

## 2023-02-25 DIAGNOSIS — R21 Rash and other nonspecific skin eruption: Secondary | ICD-10-CM

## 2023-02-25 MED ORDER — TRIAMCINOLONE ACETONIDE 0.5 % EX OINT: 1.0000 | TOPICAL_OINTMENT | Freq: Two times a day (BID) | CUTANEOUS | 0 refills | Status: DC

## 2023-03-10 ENCOUNTER — Ambulatory Visit: Payer: 59 | Admitting: Nurse Practitioner

## 2023-05-22 ENCOUNTER — Encounter: Payer: Self-pay | Admitting: Nurse Practitioner

## 2023-05-22 ENCOUNTER — Ambulatory Visit (INDEPENDENT_AMBULATORY_CARE_PROVIDER_SITE_OTHER): Payer: 59 | Admitting: Nurse Practitioner

## 2023-05-22 VITALS — BP 100/80 | HR 65 | Temp 98.4°F | Ht 63.0 in | Wt 164.2 lb

## 2023-05-22 DIAGNOSIS — N926 Irregular menstruation, unspecified: Secondary | ICD-10-CM

## 2023-05-22 DIAGNOSIS — Z2821 Immunization not carried out because of patient refusal: Secondary | ICD-10-CM | POA: Insufficient documentation

## 2023-05-22 DIAGNOSIS — Z6829 Body mass index (BMI) 29.0-29.9, adult: Secondary | ICD-10-CM

## 2023-05-22 DIAGNOSIS — Z0000 Encounter for general adult medical examination without abnormal findings: Secondary | ICD-10-CM

## 2023-05-22 DIAGNOSIS — E559 Vitamin D deficiency, unspecified: Secondary | ICD-10-CM | POA: Diagnosis not present

## 2023-05-22 DIAGNOSIS — Z8742 Personal history of other diseases of the female genital tract: Secondary | ICD-10-CM

## 2023-05-22 HISTORY — DX: Encounter for general adult medical examination without abnormal findings: Z00.00

## 2023-05-22 NOTE — Assessment & Plan Note (Signed)
 Will check vitamin D level and supplement as needed.    Also encouraged to spend 15 minutes in the sun daily.

## 2023-05-22 NOTE — Assessment & Plan Note (Signed)

## 2023-05-22 NOTE — Assessment & Plan Note (Signed)
 Behavior modifications discussed and diet history reviewed.   Pt will continue to exercise regularly and modify diet with low GI, plant based foods and decrease intake of processed foods.  Recommend intake of daily multivitamin, Vitamin D, and calcium.  Recommend monthly self breast exams for preventive screenings, as well as recommend immunizations that include influenza, TDAP

## 2023-05-22 NOTE — Assessment & Plan Note (Signed)

## 2023-05-22 NOTE — Assessment & Plan Note (Signed)
 Will refer to GYN. She does have a history of PCOS as well.

## 2023-05-22 NOTE — Progress Notes (Signed)
 LILLETTE Kristeen JINNY Gladis, CMA,acting as a neurosurgeon for Tiffany Ada, FNP.,have documented all relevant documentation on the behalf of Tiffany Ada, FNP,as directed by  Tiffany Ada, FNP while in the presence of Tiffany Ada, FNP.  Subjective:    Patient ID: Tiffany  Guerrero , female    DOB: 1985/06/11 , 38 y.o.   MRN: [id number]  Chief Complaint  Patient presents with   Annual Exam    HPI  Patient presents today for HM, Patient reports compliance with medication. Patient denies any chest pain, SOB, or headaches. Patient has no concerns today. She has been to a GYN in the past for her irregular menstrual cycles.      Past Medical History:  Diagnosis Date   Encounter for annual health examination 05/22/2023   Heart murmur 2020     Family History  Problem Relation Age of Onset   Idiopathic pulmonary fibrosis Mother    Diabetes Father    Diabetes Brother    Cancer Paternal Grandfather    Alzheimer's disease Paternal Grandfather      Current Outpatient Medications:    triamcinolone  ointment (KENALOG ) 0.5 %, Apply 1 Application topically 2 (two) times daily., Disp: 30 g, Rfl: 0   Vitamin D , Ergocalciferol , (DRISDOL ) 1.25 MG (50000 UNIT) CAPS capsule, TAKE 1 CAPSULE BY MOUTH TWICE A WEEK ON TUESDAY AND THRUSDAY, Disp: 24 capsule, Rfl: 1   No Known Allergies    The patient states she uses none for birth control. Patient's last menstrual period was 03/14/2023.. Negative for Dysmenorrhea and Negative for Menorrhagia. Negative for: breast discharge, breast lump(s), breast pain and breast self exam. Associated symptoms include abnormal vaginal bleeding. Pertinent negatives include abnormal bleeding (hematology), anxiety, decreased libido, depression, difficulty falling sleep, dyspareunia, history of infertility, nocturia, sexual dysfunction, sleep disturbances, urinary incontinence, urinary urgency, vaginal discharge and vaginal itching. Diet regular; she tries to eat well; chicken and  vegetables, granola and yogurt. The patient states her exercise level is minimal - she does report she is active.   The patient's tobacco use is:  Social History   Tobacco Use  Smoking Status Never   Passive exposure: Never  Smokeless Tobacco Never   She has been exposed to passive smoke. The patient's alcohol use is:  Social History   Substance and Sexual Activity  Alcohol Use Yes   Comment: 1-2 wine glasses per month   Additional information: Last pap 04/03/2021, next one scheduled for 04/03/2024.    Review of Systems  Constitutional: Negative.   HENT: Negative.    Eyes: Negative.   Respiratory: Negative.    Cardiovascular: Negative.   Gastrointestinal: Negative.   Endocrine: Negative.   Genitourinary: Negative.   Musculoskeletal: Negative.   Skin: Negative.   Allergic/Immunologic: Negative.   Neurological:  Negative for numbness.  Hematological: Negative.   Psychiatric/Behavioral: Negative.       Today's Vitals   05/22/23 0849  BP: 100/80  Pulse: 65  Temp: 98.4 F (36.9 C)  TempSrc: Oral  Weight: 164 lb 3.2 oz (74.5 kg)  Height: 5' 3 (1.6 m)  PainSc: 0-No pain   Body mass index is 29.09 kg/m.  Wt Readings from Last 3 Encounters:  05/22/23 164 lb 3.2 oz (74.5 kg)  02/06/23 167 lb (75.8 kg)  12/31/22 172 lb 9.6 oz (78.3 kg)     Objective:  Physical Exam Vitals reviewed.  Constitutional:      General: She is not in acute distress.    Appearance: Normal appearance. She is well-developed. She is obese.  HENT:     Head: Normocephalic and atraumatic.     Right Ear: Hearing, tympanic membrane, ear canal and external ear normal. There is no impacted cerumen.     Left Ear: Hearing, tympanic membrane, ear canal and external ear normal. There is no impacted cerumen.     Nose: Nose normal.     Mouth/Throat:     Mouth: Mucous membranes are moist.  Eyes:     General: Lids are normal.     Extraocular Movements: Extraocular movements intact.      Conjunctiva/sclera: Conjunctivae normal.     Pupils: Pupils are equal, round, and reactive to light.     Funduscopic exam:    Right eye: No papilledema.        Left eye: No papilledema.  Neck:     Thyroid : No thyroid  mass.     Vascular: No carotid bruit.  Cardiovascular:     Rate and Rhythm: Normal rate and regular rhythm.     Pulses: Normal pulses.     Heart sounds: Normal heart sounds. No murmur heard. Pulmonary:     Effort: Pulmonary effort is normal. No respiratory distress.     Breath sounds: Normal breath sounds. No wheezing.  Chest:     Chest wall: No mass.  Breasts:    Tanner Score is 5.     Right: Normal. No mass or tenderness.     Left: Normal. No mass or tenderness.  Abdominal:     General: Abdomen is flat. Bowel sounds are normal. There is no distension.     Palpations: Abdomen is soft.     Tenderness: There is no abdominal tenderness.  Genitourinary:    Comments: She will be followed by GYN Musculoskeletal:        General: No swelling. Normal range of motion.     Cervical back: Full passive range of motion without pain, normal range of motion and neck supple.     Right lower leg: No edema.     Left lower leg: No edema.  Lymphadenopathy:     Upper Body:     Right upper body: No supraclavicular, axillary or pectoral adenopathy.     Left upper body: No supraclavicular, axillary or pectoral adenopathy.  Skin:    General: Skin is warm and dry.     Capillary Refill: Capillary refill takes less than 2 seconds.  Neurological:     General: No focal deficit present.     Mental Status: She is alert and oriented to person, place, and time.     Cranial Nerves: No cranial nerve deficit.     Sensory: No sensory deficit.     Motor: No weakness.  Psychiatric:        Mood and Affect: Mood normal.        Behavior: Behavior normal.        Thought Content: Thought content normal.        Judgment: Judgment normal.         Assessment And Plan:     Encounter for annual  health examination Assessment & Plan: Behavior modifications discussed and diet history reviewed.   Pt will continue to exercise regularly and modify diet with low GI, plant based foods and decrease intake of processed foods.  Recommend intake of daily multivitamin, Vitamin D , and calcium.  Recommend monthly self breast exams for preventive screenings, as well as recommend immunizations that include influenza, TDAP   Orders: -     CBC with Differential/Platelet -  CMP14+EGFR -     Lipid panel  Vitamin D  deficiency Assessment & Plan: Will check vitamin D  level and supplement as needed.    Also encouraged to spend 15 minutes in the sun daily.    Orders: -     VITAMIN D  25 Hydroxy (Vit-D Deficiency, Fractures)  COVID-19 vaccination declined Assessment & Plan: Declines covid 19 vaccine. Discussed risk of covid 13 and if she changes her mind about the vaccine to call the office. Education has been provided regarding the importance of this vaccine but patient still declined. Advised may receive this vaccine at local pharmacy or Health Dept.or vaccine clinic. Aware to provide a copy of the vaccination record if obtained from local pharmacy or Health Dept.  Encouraged to take multivitamin, vitamin d , vitamin c and zinc to increase immune system. Aware can call office if would like to have vaccine here at office. Verbalized acceptance and understanding.     Influenza vaccination declined Assessment & Plan: Patient declined influenza vaccination at this time. Patient is aware that influenza vaccine prevents illness in 70% of healthy people, and reduces hospitalizations to 30-70% in elderly. This vaccine is recommended annually. Education has been provided regarding the importance of this vaccine but patient still declined. Advised may receive this vaccine at local pharmacy or Health Dept.or vaccine clinic. Aware to provide a copy of the vaccination record if obtained from local pharmacy or  Health Dept.  Pt is willing to accept risk associated with refusing vaccination.    BMI 29.0-29.9,adult  History of PCOS -     Ambulatory referral to Obstetrics / Gynecology  Menstrual periods irregular Assessment & Plan: Will refer to GYN. She does have a history of PCOS as well.  Orders: -     Ambulatory referral to Obstetrics / Gynecology     Return for 1 year physical. Patient was given opportunity to ask questions. Patient verbalized understanding of the plan and was able to repeat key elements of the plan. All questions were answered to their satisfaction.   Tiffany Ada, FNP  I, Tiffany Ada, FNP, have reviewed all documentation for this visit. The documentation on 05/22/23 for the exam, diagnosis, procedures, and orders are all accurate and complete.

## 2023-05-23 LAB — CMP14+EGFR
ALT: 57 [IU]/L — ABNORMAL HIGH (ref 0–32)
AST: 24 [IU]/L (ref 0–40)
Albumin: 4.3 g/dL (ref 3.9–4.9)
Alkaline Phosphatase: 123 [IU]/L — ABNORMAL HIGH (ref 44–121)
BUN/Creatinine Ratio: 18 (ref 9–23)
BUN: 11 mg/dL (ref 6–20)
Bilirubin Total: 0.3 mg/dL (ref 0.0–1.2)
CO2: 19 mmol/L — ABNORMAL LOW (ref 20–29)
Calcium: 9.2 mg/dL (ref 8.7–10.2)
Chloride: 106 mmol/L (ref 96–106)
Creatinine, Ser: 0.6 mg/dL (ref 0.57–1.00)
Globulin, Total: 2 g/dL (ref 1.5–4.5)
Glucose: 94 mg/dL (ref 70–99)
Potassium: 4.3 mmol/L (ref 3.5–5.2)
Sodium: 140 mmol/L (ref 134–144)
Total Protein: 6.3 g/dL (ref 6.0–8.5)
eGFR: 120 mL/min/{1.73_m2} (ref >59)

## 2023-05-23 LAB — CBC WITH DIFFERENTIAL/PLATELET
Basophils Absolute: 0 10*3/uL (ref 0.0–0.2)
Basos: 1 %
EOS (ABSOLUTE): 0.2 10*3/uL (ref 0.0–0.4)
Eos: 2 %
Hematocrit: 40.8 % (ref 34.0–46.6)
Hemoglobin: 13.8 g/dL (ref 11.1–15.9)
Immature Grans (Abs): 0 10*3/uL (ref 0.0–0.1)
Immature Granulocytes: 0 %
Lymphocytes Absolute: 2.8 10*3/uL (ref 0.7–3.1)
Lymphs: 36 %
MCH: 31.2 pg (ref 26.6–33.0)
MCHC: 33.8 g/dL (ref 31.5–35.7)
MCV: 92 fL (ref 79–97)
Monocytes Absolute: 0.5 10*3/uL (ref 0.1–0.9)
Monocytes: 6 %
Neutrophils Absolute: 4.3 10*3/uL (ref 1.4–7.0)
Neutrophils: 55 %
Platelets: 219 10*3/uL (ref 150–450)
RBC: 4.43 x10E6/uL (ref 3.77–5.28)
RDW: 12.5 % (ref 11.7–15.4)
WBC: 7.8 10*3/uL (ref 3.4–10.8)

## 2023-05-23 LAB — LIPID PANEL
Chol/HDL Ratio: 4.2 {ratio} (ref 0.0–4.4)
Cholesterol, Total: 225 mg/dL — ABNORMAL HIGH (ref 100–199)
HDL: 53 mg/dL (ref >39)
LDL Chol Calc (NIH): 139 mg/dL — ABNORMAL HIGH (ref 0–99)
Triglycerides: 184 mg/dL — ABNORMAL HIGH (ref 0–149)
VLDL Cholesterol Cal: 33 mg/dL (ref 5–40)

## 2023-05-23 LAB — VITAMIN D 25 HYDROXY (VIT D DEFICIENCY, FRACTURES): Vit D, 25-Hydroxy: 22.3 ng/mL — ABNORMAL LOW (ref 30.0–100.0)

## 2023-06-01 IMAGING — US US PELVIS COMPLETE WITH TRANSVAGINAL
1 series · 13 of 25 positions shown · non-contrast
Comparison: CT done on 05/06/2018

CLINICAL DATA: Abnormal uterine bleeding

EXAM:
TRANSABDOMINAL AND TRANSVAGINAL ULTRASOUND OF PELVIS
DOPPLER ULTRASOUND OF OVARIES
TECHNIQUE: Both transabdominal and transvaginal ultrasound examinations of the
pelvis were performed. Transabdominal technique was performed for
global imaging of the pelvis including uterus, ovaries, adnexal
regions, and pelvic cul-de-sac.
It was necessary to proceed with endovaginal exam following the
transabdominal exam to visualize the ovaries. Color and duplex
Doppler ultrasound was utilized to evaluate blood flow to the
ovaries.

[Series 1: us pelvic complete with transvaginal · 13 of 63 slices shown]
[im 1/63]
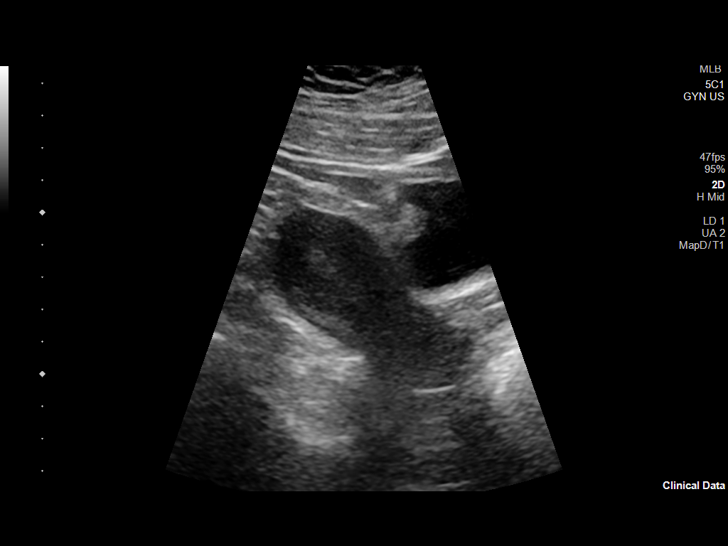
[im 6/63]
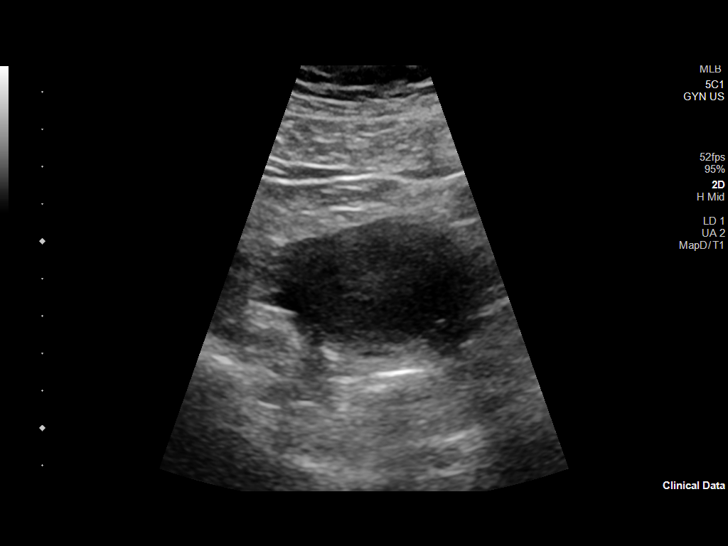
[im 11/63]
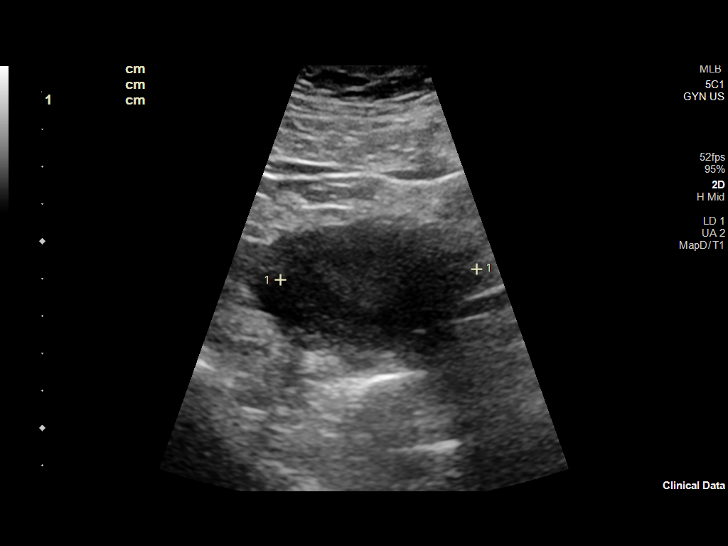
[im 16/63]
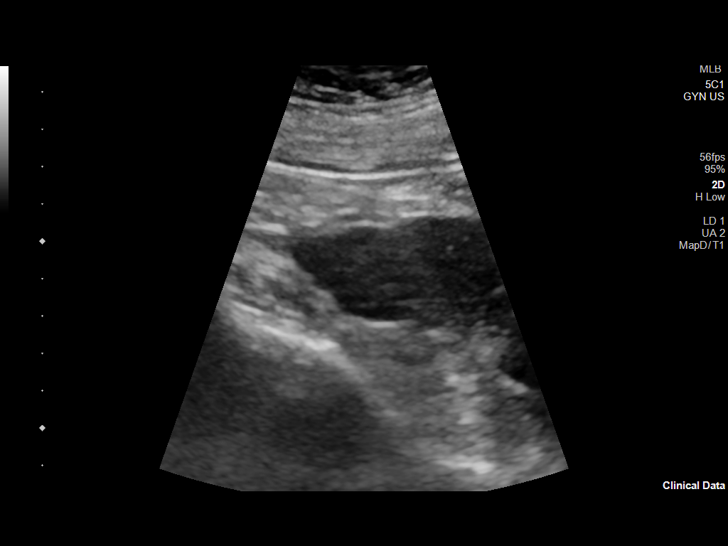
[im 21/63]
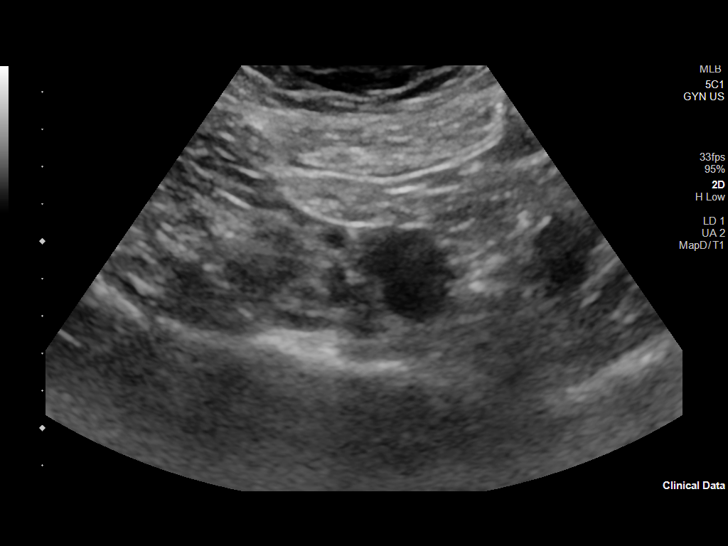
[im 26/63]
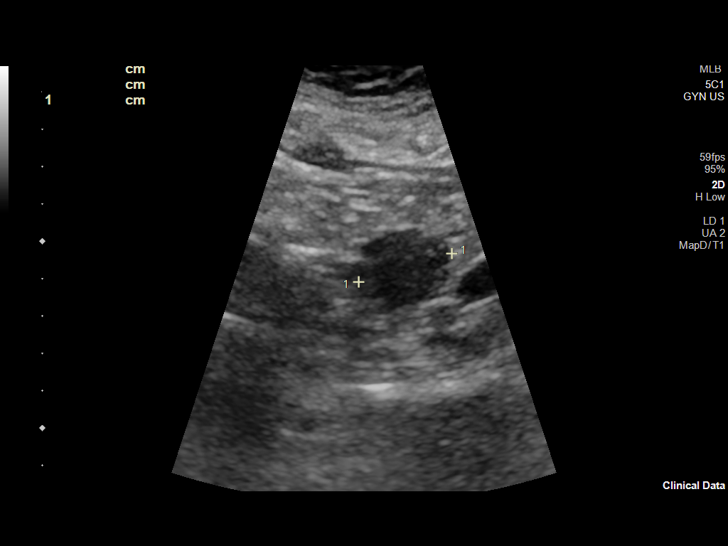
[im 32/63]
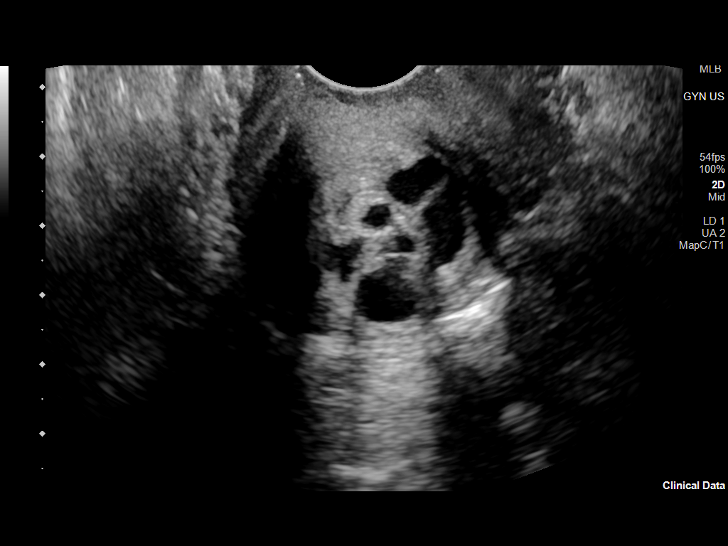
[im 37/63]
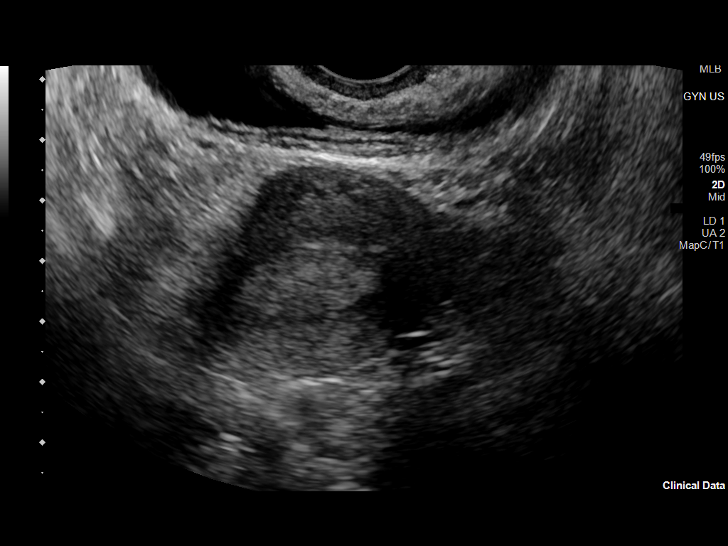
[im 42/63]
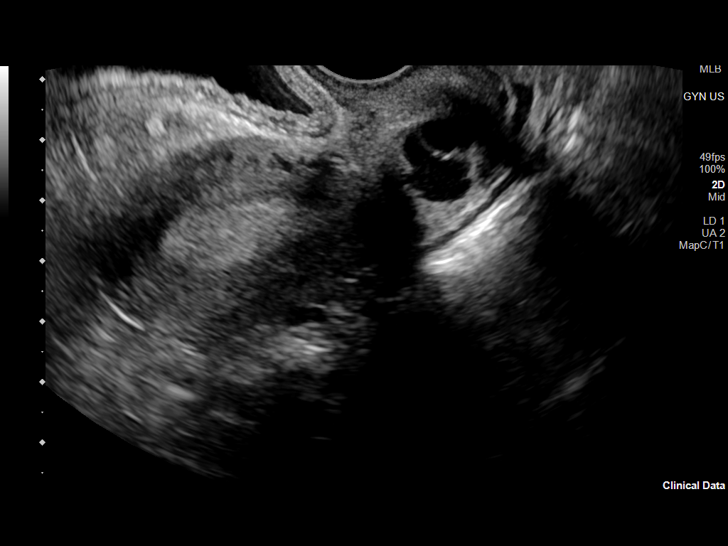
[im 47/63]
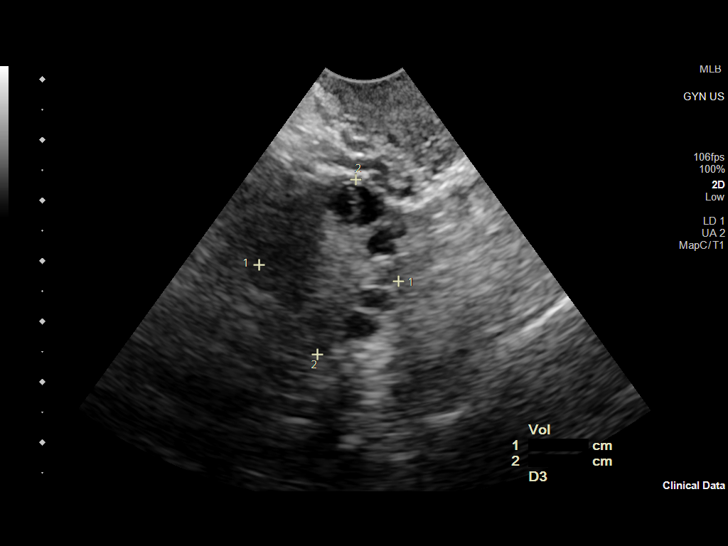
[im 52/63]
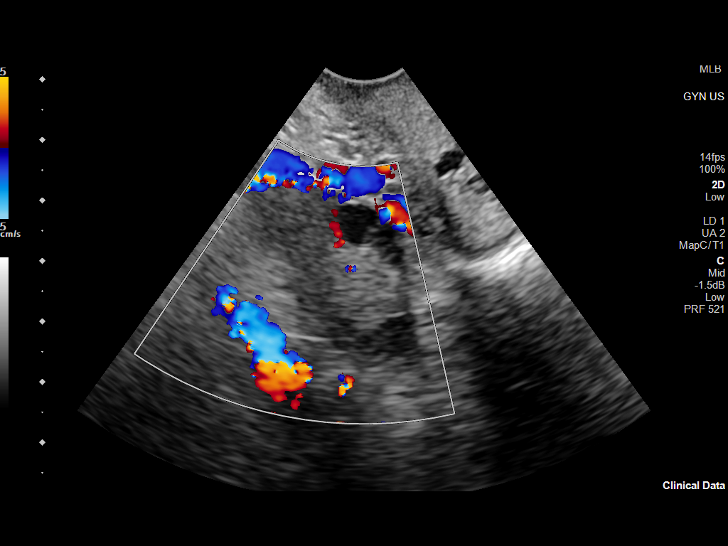
[im 57/63]
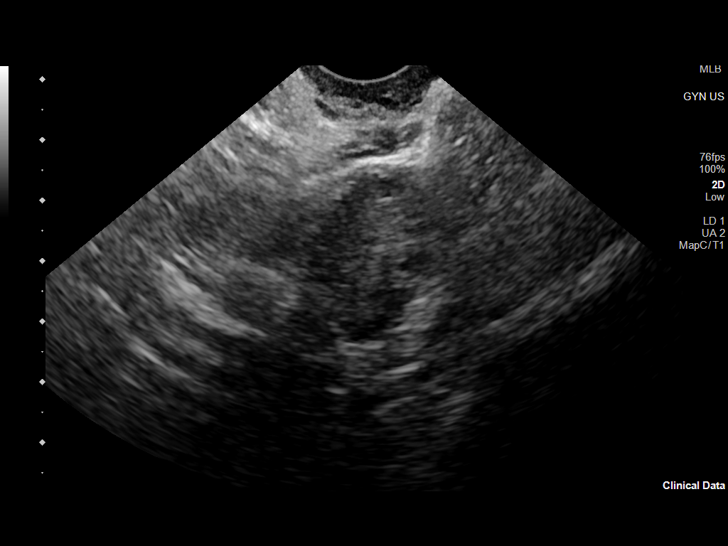
[im 63/63]
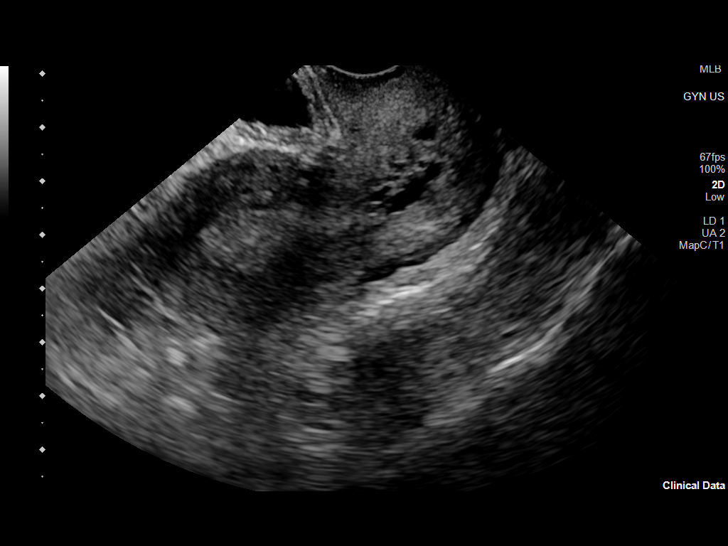

[13 of 25 positions shown; findings below may reference images not displayed]

FINDINGS: Uterus

Measurements: 7.5 x 3.5 x 4 cm = volume: 54.7 mL. No fibroids or
other mass visualized. Nabothian cysts are seen in the cervix.

Endometrium

Thickness: 10 mm. Prominence of endometrial stripe may be due to
secretory phase of menstrual cycle. There is no significant abnormal
increased vascularity in the endometrium.

Right ovary

Measurements: 2.3 x 3 x 3.6 cm = volume: 9.3 mL. There are
subcentimeter follicles in the periphery.

Left ovary

Measurements: 2.3 x 3 x 2 cm = volume: 7.2 mL. Subcentimeter
follicles are seen.

Color doppler evaluation of both ovaries demonstrates vascular flow
in both adnexal regions.

Other findings

No abnormal free fluid.
IMPRESSION: Prominence of endometrial stripe without increased vascularity most
likely is due to secretory phase of menstrual cycle.

There are subcentimeter follicles in the periphery of both ovaries
which may be normal variation or suggest polycystic ovary disease.

## 2023-07-03 ENCOUNTER — Ambulatory Visit (INDEPENDENT_AMBULATORY_CARE_PROVIDER_SITE_OTHER): Payer: 59 | Admitting: Nurse Practitioner

## 2023-07-03 ENCOUNTER — Encounter: Payer: Self-pay | Admitting: Nurse Practitioner

## 2023-07-03 VITALS — Temp 98.5°F | Ht 63.0 in | Wt 169.2 lb

## 2023-07-03 DIAGNOSIS — Z6829 Body mass index (BMI) 29.0-29.9, adult: Secondary | ICD-10-CM

## 2023-07-03 DIAGNOSIS — Z2821 Immunization not carried out because of patient refusal: Secondary | ICD-10-CM | POA: Diagnosis not present

## 2023-07-03 DIAGNOSIS — R42 Dizziness and giddiness: Secondary | ICD-10-CM | POA: Diagnosis not present

## 2023-07-03 NOTE — Progress Notes (Signed)
Madelaine Bhat, CMA,acting as a Neurosurgeon for Arnette Felts, FNP.,have documented all relevant documentation on the behalf of Arnette Felts, FNP,as directed by  Arnette Felts, FNP while in the presence of Arnette Felts, FNP.  Subjective:  Patient ID: Tiffany Guerrero , female    DOB: Feb 08, 1986 , 38 y.o.   MRN: [id number]  No chief complaint on file.   HPI  Patient presents today for a vertigo feeling since Thursday of last week. She felt unbalanced in morning going to restroom, got better, but later in the day when she turned her head fast she would have the sensation again. Had headache initially, but it resolved.  Feels fine when she is still, but if she lays back and turns to left, the feeling comes back.  Works in front of three monitors from work and it is affecting her at work, feels nauseous at end of day from turning to see all monitors.  When asked to described the feeling further, she feels as if her eyes are moving side to side, it makes her feel nauseous.  Has had vertigo before, but the sensation ws not the same.  When driving in car and stops, she feels as if she is still moving. The sensation is not worse at certain times of the day, just worst and exacerbated by lying down.  Has had some neck stiffness on the left side of her neck and took tylenol, did not help with the dizzy sensation.  Eyeglasses were changed more than 6 months ago, has her eye exam annually.  No ringing in her ears but has experienced pressure in left ear.Patient reports compliance with medication. Patient denies any chest pain, SOB. Patient reports for the past week when she moves her head a certain way she feels off balance. Patient reports when she lays down and turns her head to the left she always feels off balance. Patient reports she did have vertigo a long time ago but it went away by itself after a couple of days. Patient also reports her left ear has a funny feeling.  She has only taken tylenol but was not  effective.      Past Medical History:  Diagnosis Date   Encounter for annual health examination 05/22/2023   Heart murmur 2020     Family History  Problem Relation Age of Onset   Idiopathic pulmonary fibrosis Mother    Diabetes Father    Diabetes Brother    Cancer Paternal Grandfather    Alzheimer's disease Paternal Grandfather      Current Outpatient Medications:    triamcinolone ointment (KENALOG) 0.5 %, Apply 1 Application topically 2 (two) times daily., Disp: 30 g, Rfl: 0   Vitamin D, Ergocalciferol, (DRISDOL) 1.25 MG (50000 UNIT) CAPS capsule, TAKE 1 CAPSULE BY MOUTH TWICE A WEEK ON TUESDAY AND THRUSDAY, Disp: 24 capsule, Rfl: 1   No Known Allergies   Review of Systems  Constitutional: Negative.   HENT:  Positive for ear pain.   Eyes: Negative.   Respiratory: Negative.    Cardiovascular: Negative.   Gastrointestinal: Negative.   Genitourinary: Negative.   Musculoskeletal:  Positive for neck stiffness.  Allergic/Immunologic: Negative.   Neurological:  Positive for dizziness.  Psychiatric/Behavioral: Negative.       Today's Vitals   07/03/23 0907  Temp: 98.5 F (36.9 C)  TempSrc: Oral  Weight: 169 lb 3.2 oz (76.7 kg)  Height: 5\' 3"  (1.6 m)  PainSc: 0-No pain   Body mass index is 29.97 kg/m.  Wt Readings from Last 3 Encounters:  07/03/23 169 lb 3.2 oz (76.7 kg)  05/22/23 164 lb 3.2 oz (74.5 kg)  02/06/23 167 lb (75.8 kg)    Orthostatic Vitals for the past 48 hrs (Last 6 readings):  Orthostatic BP Orthostatic Pulse  07/03/23 0907 100/60 70  07/03/23 1001 114/70 72  07/03/23 1003 110/70 67     Objective:  Physical Exam Constitutional:      General: She is not in acute distress.    Appearance: Normal appearance.  HENT:     Head: Normocephalic and atraumatic.     Right Ear: Tympanic membrane, ear canal and external ear normal. There is no impacted cerumen.     Left Ear: Tympanic membrane, ear canal and external ear normal. There is no impacted  cerumen.     Nose: Nose normal.     Mouth/Throat:     Mouth: Mucous membranes are dry.     Pharynx: Oropharynx is clear.  Eyes:     General: Lids are normal.     Extraocular Movements: Extraocular movements intact.     Conjunctiva/sclera: Conjunctivae normal.     Pupils: Pupils are equal, round, and reactive to light.     Comments: She had mild nystagmus with eye movement left inferior  Cardiovascular:     Rate and Rhythm: Normal rate and regular rhythm.     Pulses: Normal pulses.     Heart sounds: Normal heart sounds. No murmur heard. Pulmonary:     Effort: Pulmonary effort is normal. No respiratory distress.     Breath sounds: Normal breath sounds. No wheezing.  Musculoskeletal:     Cervical back: Normal range of motion and neck supple. No tenderness.  Skin:    General: Skin is warm and dry.     Capillary Refill: Capillary refill takes less than 2 seconds.  Neurological:     General: No focal deficit present.     Mental Status: She is alert and oriented to person, place, and time.     Cranial Nerves: Cranial nerves 2-12 are intact. No cranial nerve deficit or dysarthria.     Motor: No weakness.     Coordination: Romberg sign positive (mildly).     Gait: Gait is intact.  Psychiatric:        Mood and Affect: Mood normal.        Behavior: Behavior normal.        Thought Content: Thought content normal.        Judgment: Judgment normal.      Assessment And Plan:  COVID-19 vaccination declined Assessment & Plan: Declines covid 19 vaccine. Discussed risk of covid 21 and if she changes her mind about the vaccine to call the office. Education has been provided regarding the importance of this vaccine but patient still declined. Advised may receive this vaccine at local pharmacy or Health Dept.or vaccine clinic. Aware to provide a copy of the vaccination record if obtained from local pharmacy or Health Dept.  Encouraged to take multivitamin, vitamin d, vitamin c and zinc to increase  immune system. Aware can call office if would like to have vaccine here at office. Verbalized acceptance and understanding.    Vertigo Assessment & Plan: Orthostats are negative, will check labs CBC, CMP and TSH. Pending labs will send for CT scan Slightly positive romberg. DDX: benign paroxysmal vertigo vs orthostatic hypotension vs neurologic disorder  Orders: -     TSH -     CBC -  CMP14+EGFR  Influenza vaccination declined Assessment & Plan: Patient declined influenza vaccination at this time. Patient is aware that influenza vaccine prevents illness in 70% of healthy people, and reduces hospitalizations to 30-70% in elderly. This vaccine is recommended annually. Education has been provided regarding the importance of this vaccine but patient still declined. Advised may receive this vaccine at local pharmacy or Health Dept.or vaccine clinic. Aware to provide a copy of the vaccination record if obtained from local pharmacy or Health Dept.  Pt is willing to accept risk associated with refusing vaccination.    BMI 29.0-29.9,adult    No follow-ups on file.  Patient was given opportunity to ask questions. Patient verbalized understanding of the plan and was able to repeat key elements of the plan. All questions were answered to their satisfaction.    Jeanell Sparrow, FNP, have reviewed all documentation for this visit. The documentation on 07/03/23 for the exam, diagnosis, procedures, and orders are all accurate and complete.   IF YOU HAVE BEEN REFERRED TO A SPECIALIST, IT MAY TAKE 1-2 WEEKS TO SCHEDULE/PROCESS THE REFERRAL. IF YOU HAVE NOT HEARD FROM US/SPECIALIST IN TWO WEEKS, PLEASE GIVE [place] A CALL AT [phone].

## 2023-07-03 NOTE — Assessment & Plan Note (Addendum)
Orthostats are negative, will check labs CBC, CMP and TSH. Pending labs will send for CT scan Slightly positive romberg. DDX: benign paroxysmal vertigo vs orthostatic hypotension vs neurologic disorder

## 2023-07-03 NOTE — Assessment & Plan Note (Signed)

## 2023-07-03 NOTE — Assessment & Plan Note (Signed)

## 2023-07-04 LAB — CMP14+EGFR
ALT: 41 [IU]/L — ABNORMAL HIGH (ref 0–32)
AST: 22 [IU]/L (ref 0–40)
Albumin: 4.3 g/dL (ref 3.9–4.9)
Alkaline Phosphatase: 99 [IU]/L (ref 44–121)
BUN/Creatinine Ratio: 20 (ref 9–23)
BUN: 13 mg/dL (ref 6–20)
Bilirubin Total: 0.4 mg/dL (ref 0.0–1.2)
CO2: 22 mmol/L (ref 20–29)
Calcium: 9.2 mg/dL (ref 8.7–10.2)
Chloride: 107 mmol/L — ABNORMAL HIGH (ref 96–106)
Creatinine, Ser: 0.65 mg/dL (ref 0.57–1.00)
Globulin, Total: 2.1 g/dL (ref 1.5–4.5)
Glucose: 91 mg/dL (ref 70–99)
Potassium: 4.4 mmol/L (ref 3.5–5.2)
Sodium: 142 mmol/L (ref 134–144)
Total Protein: 6.4 g/dL (ref 6.0–8.5)
eGFR: 118 mL/min/{1.73_m2} (ref >59)

## 2023-07-04 LAB — CBC
Hematocrit: 41.8 % (ref 34.0–46.6)
Hemoglobin: 14 g/dL (ref 11.1–15.9)
MCH: 31 pg (ref 26.6–33.0)
MCHC: 33.5 g/dL (ref 31.5–35.7)
MCV: 93 fL (ref 79–97)
Platelets: 243 10*3/uL (ref 150–450)
RBC: 4.52 x10E6/uL (ref 3.77–5.28)
RDW: 13.2 % (ref 11.7–15.4)
WBC: 7 10*3/uL (ref 3.4–10.8)

## 2023-07-04 LAB — TSH: TSH: 1.86 u[IU]/mL (ref 0.450–4.500)

## 2023-07-17 ENCOUNTER — Encounter: Payer: Self-pay | Admitting: Obstetrics and Gynecology

## 2023-07-17 ENCOUNTER — Ambulatory Visit: Payer: 59 | Admitting: Obstetrics and Gynecology

## 2023-07-17 VITALS — BP 134/80 | HR 96 | Temp 98.0°F | Ht 63.25 in | Wt 164.0 lb

## 2023-07-17 DIAGNOSIS — E221 Hyperprolactinemia: Secondary | ICD-10-CM | POA: Diagnosis not present

## 2023-07-17 DIAGNOSIS — E282 Polycystic ovarian syndrome: Secondary | ICD-10-CM

## 2023-07-17 LAB — PROLACTIN: Prolactin: 6.8 ng/mL

## 2023-07-17 LAB — TESTOSTERONE: Testosterone: 29 ng/dL

## 2023-07-17 MED ORDER — MEDROXYPROGESTERONE ACETATE 10 MG PO TABS: 10.0000 mg | ORAL_TABLET | Freq: Every day | ORAL | 3 refills | Status: AC

## 2023-07-17 NOTE — Progress Notes (Signed)
 38 y.o. G0P0000 female with PCOS and mild HPL here for consultation for management. Married x10 yr female partner. 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  Hx of PCOS and discuss irregular cycles - was told prolactin levels were high and she has had several ultrasounds. Never was diagnosed or received any answers to her irregular cycles   Seen by [phone] with complete w/u. Referred to endocrinologist, still seeing. Dx'd with PCOS (mild per endo) Managed irregular periods with provera however she ran out Last cycle October, would like to restart provera. PRL, highest was 28, decreased to 14 in 2023, not checked since Never on medications for hyperPRL Walking for exercise, not as regular as she should be Not planning for pregnancy over next year SA with female partner  Patient's last menstrual period was 03/14/2023.   Birth control: none Sexually active: yes   GYN HISTORY: PCOS  OB History  Gravida Para Term Preterm AB Living  0 0 0 0 0 0  SAB IAB Ectopic Multiple Live Births  0 0 0 0 0    Past Medical History:  Diagnosis Date   Encounter for annual health examination 05/22/2023   Heart murmur 2020    Past Surgical History:  Procedure Laterality Date   WISDOM TOOTH EXTRACTION      Current Outpatient Medications on File Prior to Visit  Medication Sig Dispense Refill   triamcinolone ointment (KENALOG) 0.5 % Apply 1 Application topically 2 (two) times daily. (Patient not taking: Reported on 07/17/2023) 30 g 0   Vitamin D, Ergocalciferol, (DRISDOL) 1.25 MG (50000 UNIT) CAPS capsule TAKE 1 CAPSULE BY MOUTH TWICE A WEEK ON TUESDAY AND THRUSDAY 24 capsule 1   No current facility-administered medications on file prior to visit.    No Known Allergies    PE Today's Vitals   07/17/23 1352  BP: 134/80  Pulse: 96  Temp: 98 F (36.7 C)  TempSrc: Oral  SpO2: 98%  Weight: 164 lb (74.4 kg)  Height: 5' 3.25" (1.607 m)   Body mass index is 28.82  kg/m.  Physical Exam Vitals reviewed.  Constitutional:      General: She is not in acute distress.    Appearance: Normal appearance.  HENT:     Head: Normocephalic and atraumatic.     Nose: Nose normal.  Eyes:     Extraocular Movements: Extraocular movements intact.     Conjunctiva/sclera: Conjunctivae normal.  Pulmonary:     Effort: Pulmonary effort is normal.  Musculoskeletal:        General: Normal range of motion.     Cervical back: Normal range of motion.  Neurological:     General: No focal deficit present.     Mental Status: She is alert.  Psychiatric:        Mood and Affect: Mood normal.        Behavior: Behavior normal.      Assessment and Plan:        Hyperprolactinemia (HCC) -     medroxyPROGESTERone Acetate; Take 1 tablet (10 mg total) by mouth daily for 10 days. Take to induce menses if no cycle in 3 months.  Dispense: 10 tablet; Refill: 3 -     Prolactin -     Testosterone  PCOS (polycystic ovarian syndrome)  Discussed diagnosis of PCOS based on irregular cycles, androgen excess and/or abnormal [place]. Reviewed health implications including menstrual irregularity, subfertility, and risk of metabolic syndrome. Discussed management based on current symptoms. Hormonal therapy was recommend  for irregular cycle. Prefers q3 month period Reviewed the importance of weight management and cycle regulation and ovulation.  Recommend a 5-10% weight decrease to improve cycle regularity. I recommend starting a regular exercise program that includes 150 minutes of exercise a week. We also discussed carb management and a healthy diet.  Rosalyn Gess, MD

## 2023-07-24 ENCOUNTER — Encounter: Payer: Self-pay | Admitting: Obstetrics and Gynecology

## 2023-07-25 ENCOUNTER — Encounter: Payer: Self-pay | Admitting: Obstetrics and Gynecology

## 2023-12-31 ENCOUNTER — Encounter: Payer: Self-pay | Admitting: Internal Medicine

## 2023-12-31 ENCOUNTER — Ambulatory Visit: Payer: 59 | Admitting: Internal Medicine

## 2023-12-31 VITALS — BP 130/80 | HR 93 | Ht 63.25 in | Wt 170.2 lb

## 2023-12-31 DIAGNOSIS — E559 Vitamin D deficiency, unspecified: Secondary | ICD-10-CM

## 2023-12-31 DIAGNOSIS — E221 Hyperprolactinemia: Secondary | ICD-10-CM | POA: Diagnosis not present

## 2023-12-31 DIAGNOSIS — E282 Polycystic ovarian syndrome: Secondary | ICD-10-CM

## 2023-12-31 LAB — POCT GLYCOSYLATED HEMOGLOBIN (HGB A1C): Hemoglobin A1C: 5.6 % (ref 4.0–5.6)

## 2023-12-31 NOTE — Progress Notes (Signed)
 Patient ID: Tiffany Guerrero, female   DOB: 04/06/1986, 38 y.o.   MRN: [id number]  HPI  Tiffany Guerrero is a 38 y.o.-year-old female, initially referred by her PCP, Dr. Purcell, returning for follow-up for irregular menstrual cycles and hyperprolactinemia.  Last visit 1 year ago.  Interim history: She continues on Provera  by OB/GYN.  She is aiming for 4 cycles a year. Her acne improved - rarely having 1 or 2 acne spots now. She had abdominal rash - shingles last year. This resolved.   Reviewed and addended history: Pt. has been found to have a high prolactin level in 03/2021 during investigation for irregular menstrual cycles.  At that time, she presented with: -Irregular menstrual cycles -but she always had these, since menarche at 38 years old: skipping 3-4 mo (ave 3-4 cycles a year). She has spotting for up to 1 week before the cycles. -Hirsutism -Weight gain/inability to lose weight  But no: -Galactorrhea  Patient prolactin levels were reviewed: Lab Results  Component Value Date   PROLACTIN 6.8 07/17/2023   PROLACTIN 14.2 12/20/2021   PROLACTIN 10.1 06/21/2021   PROLACTIN 29.7 (H) 04/06/2021   PROLACTIN 23.4 (H) 04/03/2021   Patient's thyroid  tests were also reviewed and these were normal: Lab Results  Component Value Date   TSH 1.860 07/03/2023   TSH 1.720 02/22/2021   TSH 1.720 12/18/2016   FREET4 1.36 02/22/2021    Of note, her testosterone , FSH, and DHEA-S levels were normal: Component     Latest Ref Rng 07/17/2023  Testosterone      ng/dL 29    Component     Latest Ref Rng & Units 04/03/2021  Testosterone      8 - 60 ng/dL 16  Testosterone  Free     0.0 - 4.2 pg/mL 1.0  FSH     mIU/mL 7.0  DHEA-SO4     84.8 - 378.0 ug/dL 890.9   Dexamethasone  suppression test was normal: Component     Latest Ref Rng & Units 06/25/2021  Cortisol - AM     mcg/dL 0.8 (L)   Prolactin, Androstenedione, and 17 hydroxyprogesterone levels were also  normal: Component     Latest Ref Rng & Units 06/21/2021  Prolactin     ng/mL 10.1  17-OH-Progesterone, LC/MS/MS      ng/dL 70  ANDROSTENEDIONE     ng/dL 841   She is not on Risperdal, oral contraceptives, Reglan.  She had a transvaginal ultrasound (05/28/2021): Prominence of endometrial stripe without increased vascularity most likely is due to secretory phase of menstrual cycle.   There are subcentimeter follicles in the periphery of both ovaries which may be normal variation or suggest polycystic ovary disease.  Pt. also had vitamin D  deficiency - prev. on ergocalciferol , then on 2000 units daily - but off now: Lab Results  Component Value Date   VD25OH 22.3 (L) 05/22/2023   VD25OH 75 12/05/2022   VD25OH 24.3 (L) 05/07/2022   VD25OH 12.14 (L) 12/20/2021   VD25OH 10.6 (L) 02/22/2021   She works on the Anim[employer]. She has a female spouse.   No pregnancy desired for now.  Last HbA1c was normal. Lab Results  Component Value Date   HGBA1C 5.6 12/31/2022   She saw Dr. Dolphus since last visit, after an ANA returned elevated.  Autoimmune conditions were ruled out.  ROS: Constitutional: + See HPI  Past Medical History:  Diagnosis Date   Encounter for annual health examination 05/22/2023   Heart murmur 2020   Past Surgical  History:  Procedure Laterality Date   WISDOM TOOTH EXTRACTION     Social History   Socioeconomic History   Marital status: Married    Spouse name: Not on file   Number of children: 0   Years of education: Not on file   Highest education level: Not on file  Occupational History   Occupation: Engineer/analyst  Tobacco Use   Smoking status: Never    Passive exposure: Never   Smokeless tobacco: Never  Vaping Use   Vaping status: Never Used  Substance and Sexual Activity   Alcohol use: Yes    Comment: 1-2 wine glasses per month   Drug use: Never   Sexual activity: Yes    Birth control/protection: None    Comment: same sex relationship  Other  Topics Concern   Not on file  Social History Narrative   Art gallery manager with [employer]    Social Drivers of Health   Financial Resource Strain: Not on file  Food Insecurity: Not on file  Transportation Needs: Not on file  Physical Activity: Not on file  Stress: Not on file  Social Connections: Not on file  Intimate Partner Violence: Not on file   Current Outpatient Medications on File Prior to Visit  Medication Sig Dispense Refill   medroxyPROGESTERone  (PROVERA ) 10 MG tablet Take 1 tablet (10 mg total) by mouth daily for 10 days. Take to induce menses if no cycle in 3 months. 10 tablet 3   triamcinolone  ointment (KENALOG ) 0.5 % Apply 1 Application topically 2 (two) times daily. (Patient not taking: Reported on 07/17/2023) 30 g 0   Vitamin D , Ergocalciferol , (DRISDOL ) 1.25 MG (50000 UNIT) CAPS capsule TAKE 1 CAPSULE BY MOUTH TWICE A WEEK ON TUESDAY AND THRUSDAY 24 capsule 1   No current facility-administered medications on file prior to visit.   No Known Allergies Family History  Problem Relation Age of Onset   Idiopathic pulmonary fibrosis Mother    Diabetes Father    Diabetes Brother    Cancer Paternal Grandfather    Alzheimer's disease Paternal Grandfather    PE: BP 130/80   Pulse 93   Ht 5' 3.25 (1.607 m)   Wt 170 lb 3.2 oz (77.2 kg)   SpO2 97%   BMI 29.91 kg/m  Wt Readings from Last 3 Encounters:  12/31/23 170 lb 3.2 oz (77.2 kg)  07/17/23 164 lb (74.4 kg)  07/03/23 169 lb 3.2 oz (76.7 kg)   Constitutional: overweight - truncal obesity, in NAD, + increased interscapular fat pad (buffalo hump) Eyes: EOMI, no exophthalmos ENT: no thyromegaly, no cervical lymphadenopathy Cardiovascular: RRR, No MRG Respiratory: CTA B Musculoskeletal: no deformities Skin: no acne spots on face, no dark terminal hair on chin or sideburns, + acanthosis nigricans on neck Neurological: no tremor with outstretched hands  ASSESSMENT: 1.  Hyperprolactinemia  2.  PCOS  3.  Vit D  deficiency  PLAN: Patient with history of slightly high prolactin level discovered during investigation for irregular menstrual cycles. - We previously reviewed possible etiology for the higher prolactin levels: Pituitary microadenoma secreting prolactin, but also: Pregnancy Hypothyroidism -she had normal TFTs PCOS -she does have this Stress  Exercise  Lack of sleep  Medications (nopsychotropic medications or Reglan) Drugs (denies) Chest wall lesions (denies) Seizures (denies) Liver ds (no history of ~) Kidney disease (no history of ~) A teratoma containing pituitary cells that can develop into prolactinoma (very rarely) Macroprolactin (multimeric prolactin, especially in patients without galactorrhea) Idiopathic - She did have a  slightly high prolactin but not enough to impact her menstrual cycles, which are mostly affected by prolactin level higher than 30 - Latest prolactin levels were normal: Lab Results  Component Value Date   PROLACTIN 6.8 07/17/2023   PROLACTIN 14.2 12/20/2021   PROLACTIN 10.1 06/21/2021   PROLACTIN 29.7 (H) 04/06/2021   PROLACTIN 23.4 (H) 04/03/2021  - No further investigation is needed  2.  Mild PCOS - She has a history of irregular menstrual cycles for her entire life  - Testosterone  and DHEA-S levels were normal.  Also, androstenedione and 17 hydroxyprogesterone levels were normal, making CAH unlikely.  She does have mild elevated prolactin level, which can be seen in PCOS.  Of note, we checked a dexamethasone  suppression test (as she also has a buffalo hump), but this was normal.  She mentioned that the hump appeared after she gained weight in college.  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 - She is also followed by OB/GYN-had transvaginal ultrasound showing peripheral small ovarian cysts, possibly a normal variation or due to PCOS -We previously discussed about options for treatment for her irregular menstrual cycles: OCPs, Provera , metformin.   She is on Provera  every 3 months per OB/GYN. - At today's visit, she does not have complaint of hirsutism or acne.  She tells me she does not always have to take Provera , but she knows that she needs to have approximately 4 menstrual cycles a year so she is taking it as needed for this.  We discussed about why this is important. - Latest HbA1c was normal, at 5.6% last year.  We repeated this today >> 5.6%, stable, normal - However, if normal, she can continue to be followed by OB/GYN and return to see me as needed  3. Vitamin D  deficiency -She had a very low vitamin D  level in 02/2021, at 10.6 -Afterwards, she was on ergocalciferol , then vitamin D3 daily 5000 units daily, and then back to ergocalciferol  50,000 units weekly.  At last visit, she was just advised to start 2000 units vitamin D3 daily by rheumatology. - Latest vitamin D  level was still low but she mentions that she is not taking this. Lab Results  Component Value Date   VD25OH 22.3 (L) 05/22/2023  - Discussed about the importance of compliance with vitamin D .  Further management per rheumatology  Lela Fendt, MD PhD [facility] Endocrinology

## 2023-12-31 NOTE — Addendum Note (Signed)
 Addended by: CLEOTILDE ROLIN RAMAN on: 12/31/2023 08:54 AM   Modules accepted: Orders

## 2023-12-31 NOTE — Patient Instructions (Addendum)
 Please return to see me as needed.

## 2024-02-10 ENCOUNTER — Other Ambulatory Visit: Payer: Self-pay | Admitting: Obstetrics and Gynecology

## 2024-02-10 DIAGNOSIS — N926 Irregular menstruation, unspecified: Secondary | ICD-10-CM

## 2024-05-04 ENCOUNTER — Ambulatory Visit

## 2024-05-04 ENCOUNTER — Ambulatory Visit: Attending: Rheumatology | Admitting: Rheumatology

## 2024-05-04 ENCOUNTER — Encounter: Payer: Self-pay | Admitting: Rheumatology

## 2024-05-04 VITALS — BP 144/87 | HR 73 | Temp 98.2°F | Resp 14 | Ht 63.0 in | Wt 174.8 lb

## 2024-05-04 DIAGNOSIS — E221 Hyperprolactinemia: Secondary | ICD-10-CM

## 2024-05-04 DIAGNOSIS — G8929 Other chronic pain: Secondary | ICD-10-CM

## 2024-05-04 DIAGNOSIS — M79671 Pain in right foot: Secondary | ICD-10-CM

## 2024-05-04 DIAGNOSIS — R7689 Other specified abnormal immunological findings in serum: Secondary | ICD-10-CM

## 2024-05-04 DIAGNOSIS — M25541 Pain in joints of right hand: Secondary | ICD-10-CM

## 2024-05-04 DIAGNOSIS — M249 Joint derangement, unspecified: Secondary | ICD-10-CM

## 2024-05-04 DIAGNOSIS — E559 Vitamin D deficiency, unspecified: Secondary | ICD-10-CM

## 2024-05-04 DIAGNOSIS — N926 Irregular menstruation, unspecified: Secondary | ICD-10-CM

## 2024-05-04 NOTE — Progress Notes (Signed)
 Office Visit Note  Patient: Tiffany  Nelline Guerrero             Date of Birth: [birth year]           MRN: [id number]             PCP: Georgina Speaks, FNP Referring: Georgina Speaks, FNP Visit Date: 05/04/2024 Occupation: Data Unavailable  Subjective:  Pain in hands and left wrist swelling   History of Present Illness: Axel  Lilyanne Mcquown is a 38 y.o. female with arthralgias and positive ANA.  She returns today after her last visit in July 2024.  She gives history of arthralgias and intermittent swelling.  She states for the last 3 to 4 months she has been having increased pain and discomfort in her hands.  She notices some swelling in her hands which she describes over her left wrist and her MCPs.  She states she had some discomfort in her right toe when she made the appointment.  She has intermittent discomfort over the right trochanteric bursae especially at the nighttime when she sleeps.  She has intermittent discomfort in her lower back.  There is no history of oral ulcers, nasal ulcers, sicca symptoms, malar rash, Raynaud's, photosensitivity or lymphadenopathy.  She denies any history of palpitations or shortness of breath.    Activities of Daily Living:  Patient reports morning stiffness for 0 minutes.   Patient Reports nocturnal pain.  Difficulty dressing/grooming: Denies Difficulty climbing stairs: Denies Difficulty getting out of chair: Denies Difficulty using hands for taps, buttons, cutlery, and/or writing: Denies  Review of Systems  Constitutional:  Negative for fatigue.  HENT:  Negative for mouth sores and mouth dryness.   Eyes:  Negative for dryness.  Respiratory:  Negative for shortness of breath.   Cardiovascular:  Negative for chest pain and palpitations.  Gastrointestinal:  Negative for blood in stool, constipation and diarrhea.  Endocrine: Positive for increased urination.  Genitourinary:  Negative for involuntary urination.  Musculoskeletal:  Positive for joint  pain, joint pain and joint swelling. Negative for gait problem, myalgias, muscle weakness, morning stiffness, muscle tenderness and myalgias.  Skin:  Negative for color change, rash and sensitivity to sunlight.  Allergic/Immunologic: Negative for susceptible to infections.  Neurological:  Positive for numbness. Negative for dizziness and headaches.  Hematological:  Negative for swollen glands.  Psychiatric/Behavioral:  Negative for depressed mood and sleep disturbance. The patient is not nervous/anxious.     PMFS History:  Patient Active Problem List   Diagnosis Date Noted   Vertigo 07/03/2023   COVID-19 vaccination declined 05/22/2023   Vitamin D  deficiency 05/22/2023   Encounter for annual health examination 05/22/2023   BMI 29.0-29.9,adult 05/22/2023   Rash 02/19/2023   Influenza vaccination declined 02/19/2023   Hyperprolactinemia 07/02/2021   Menstrual periods irregular 07/02/2021   Dorsocervical fat pad 07/02/2021   Heart murmur 11/17/2018    Past Medical History:  Diagnosis Date   Encounter for annual health examination 05/22/2023   Heart murmur 2020    Family History  Problem Relation Age of Onset   Idiopathic pulmonary fibrosis Mother    Diabetes Father    Diabetes Brother    Cancer Paternal Grandfather    Alzheimer's disease Paternal Grandfather    Past Surgical History:  Procedure Laterality Date   WISDOM TOOTH EXTRACTION     Social History[1] Social History   Social History Narrative   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  Constitutional:      Appearance: She is well-developed.  HENT:     Head:  Normocephalic and atraumatic.  Eyes:     Conjunctiva/sclera: Conjunctivae normal.  Cardiovascular:     Rate and Rhythm: Normal rate and regular rhythm.     Heart sounds: Murmur heard.  Pulmonary:     Effort: Pulmonary effort is normal.     Breath sounds: Normal breath sounds.  Abdominal:     General: Bowel sounds are normal.     Palpations: Abdomen is soft.  Musculoskeletal:     Cervical back: Normal range of motion.  Lymphadenopathy:     Cervical: No cervical adenopathy.  Skin:    General: Skin is warm and dry.     Capillary Refill: Capillary refill takes less than 2 seconds.  Neurological:     Mental Status: She is alert and oriented to person, place, and time.  Psychiatric:        Behavior: Behavior normal.      Musculoskeletal Exam: Cervical, thoracic and lumbar spine were in good range of motion.  There was no SI joint tenderness.  Shoulder joints, elbow joints, wrist joints, MCPs, PIPs and DIPs were in good range of motion with no synovitis.  Hip joints and knee joints were in good range of motion without any warmth swelling or effusion.  There was no tenderness over ankles or MTPs.  She had hypermobility in her joints.  She was able to reach the floor with the palm of her hand.   CDAI Exam: CDAI Score: -- Patient Global: --; Provider Global: -- Swollen: --; Tender: -- Joint Exam 05/04/2024   No joint exam has been documented for this visit   There is currently no information documented on the homunculus. Go to the Rheumatology activity and complete the homunculus joint exam.  Investigation: No additional findings.  Imaging: No results found.  Recent Labs: Lab Results  Component Value Date   WBC 7.0 07/03/2023   HGB 14.0 07/03/2023   PLT 243 07/03/2023   NA 142 07/03/2023   K 4.4 07/03/2023   CL 107 (H) 07/03/2023   CO2 22 07/03/2023   GLUCOSE 91 07/03/2023   BUN 13 07/03/2023   CREATININE 0.65 07/03/2023   BILITOT 0.4 07/03/2023   ALKPHOS 99  07/03/2023   AST 22 07/03/2023   ALT 41 (H) 07/03/2023   PROT 6.4 07/03/2023   ALBUMIN 4.3 07/03/2023   CALCIUM 9.2 07/03/2023   GFRAA 141 11/17/2018     December 05, 2022 ESR 22, ANA negative, Smith negative, RNP negative, SSA negative, SSB negative, RF negative, anti-CCP negative, C3-C4 normal, vitamin D  75   Speciality Comments: No specialty comments available.  Procedures:  No procedures performed Allergies: Patient has no known allergies.   Assessment / Plan:     Visit Diagnoses: Arthralgia of both hands -patient complains of ongoing pain and discomfort in her both hands.  She describes discomfort over PIP joints and sometimes over the left second MCP joint.  She also describes discomfort in her left wrist joint.  No synovitis was noted on the examination today.  I will repeat x-rays today.  I will also schedule ultrasound for bilateral hands.  X-rays of the bilateral hands obtained at the last visit were unremarkable. - Plan: XR Hand 2 View Right, XR Hand 2 View Left, x-rays of bilateral hands were reviewed which were unremarkable.  X-ray findings were reviewed with the patient.  Ultrasound of bilateral hands was also obtained which showed no synovitis or tenosynovitis.  Sedimentation rate, Rheumatoid factor, Cyclic citrul peptide antibody, IgG, C-reactive protein.  A handout on hand exercise was placed in the AVS.  I advised her to contact me if she develops any increased swelling.  Will contact her once the lab results are available.  Pain in both feet -she complains of discomfort in her feet especially her right foot.  She states her right first MTP was painful recently.  No synovitis was noted on the examination.  Plan: XR Foot 2 Views Right, XR Foot 2 Views Left.  X-rays of bilateral feet were unremarkable.  X-ray findings were reviewed with the patient.  Hypermobility of joint-hypermobility was noted in multiple joints.  Increased arthralgias with hypermobility was  discussed.  Chronic midline low back pain without sciatica -she has intermittent discomfort in her lower back.  There was no point tenderness.  X-rays obtained at the last visit were unremarkable.  Vitamin D  deficiency -patient states she is supposed to take vitamin D  2000 units daily but she forgets.  Plan: VITAMIN D  25 Hydroxy (Vit-D Deficiency, Fractures)  Positive ANA (antinuclear antibody) - initially seen for positive ANA and arthralgias.Repeat ANA is negative, ENA panel negative, complements normal. Sedimentation rate is also close to normal. -Patient wants to recheck ANA today.  Plan: ANA, CBC with Differential/Platelet, Comprehensive metabolic panel with GFR  Hyperprolactinemia  Menstrual periods irregular  Orders: Orders Placed This Encounter  Procedures   XR Hand 2 View Right   XR Hand 2 View Left   XR Foot 2 Views Right   XR Foot 2 Views Left   Sedimentation rate   Rheumatoid factor   Cyclic citrul peptide antibody, IgG   ANA   C-reactive protein   CBC with Differential/Platelet   Comprehensive metabolic panel with GFR   VITAMIN D  25 Hydroxy (Vit-D Deficiency, Fractures)   No orders of the defined types were placed in this encounter.   Follow-Up Instructions: Return in about 6 months (around 11/02/2024) for Pain in joints.   Maya Nash, MD  Note - This record has been created using Animal nutritionist.  Chart creation errors have been sought, but may not always  have been located. Such creation errors do not reflect on  the standard of medical care.     [1]  Social History Tobacco Use   Smoking status: Never    Passive exposure: Never   Smokeless tobacco: Never  Vaping Use   Vaping status: Never Used  Substance Use Topics   Alcohol use: Yes    Comment: occ   Drug use: Never

## 2024-05-04 NOTE — Patient Instructions (Signed)

## 2024-05-06 LAB — CBC WITH DIFFERENTIAL/PLATELET
Absolute Lymphocytes: 3199 {cells}/uL (ref 850–3900)
Absolute Monocytes: 688 {cells}/uL (ref 200–950)
Basophils Absolute: 56 {cells}/uL (ref 0–200)
Basophils Relative: 0.6 %
Eosinophils Absolute: 140 {cells}/uL (ref 15–500)
Eosinophils Relative: 1.5 %
HCT: 42.8 % (ref 35.9–46.0)
Hemoglobin: 14.2 g/dL (ref 11.7–15.5)
MCH: 30.7 pg (ref 27.0–33.0)
MCHC: 33.2 g/dL (ref 31.6–35.4)
MCV: 92.6 fL (ref 81.4–101.7)
MPV: 9 fL (ref 7.5–12.5)
Monocytes Relative: 7.4 %
Neutro Abs: 5217 {cells}/uL (ref 1500–7800)
Neutrophils Relative %: 56.1 %
Platelets: 281 Thousand/uL (ref 140–400)
RBC: 4.62 Million/uL (ref 3.80–5.10)
RDW: 12.7 % (ref 11.0–15.0)
Total Lymphocyte: 34.4 %
WBC: 9.3 Thousand/uL (ref 3.8–10.8)

## 2024-05-06 LAB — COMPREHENSIVE METABOLIC PANEL WITH GFR
AG Ratio: 1.6 (calc) (ref 1.0–2.5)
ALT: 55 U/L — ABNORMAL HIGH (ref 6–29)
AST: 28 U/L (ref 10–30)
Albumin: 4.4 g/dL (ref 3.6–5.1)
Alkaline phosphatase (APISO): 101 U/L (ref 31–125)
BUN: 13 mg/dL (ref 7–25)
CO2: 28 mmol/L (ref 20–32)
Calcium: 9.5 mg/dL (ref 8.6–10.2)
Chloride: 102 mmol/L (ref 98–110)
Creat: 0.61 mg/dL (ref 0.50–0.97)
Globulin: 2.7 g/dL (ref 1.9–3.7)
Glucose, Bld: 85 mg/dL (ref 65–99)
Potassium: 4.1 mmol/L (ref 3.5–5.3)
Sodium: 137 mmol/L (ref 135–146)
Total Bilirubin: 0.4 mg/dL (ref 0.2–1.2)
Total Protein: 7.1 g/dL (ref 6.1–8.1)
eGFR: 119 mL/min/1.73m2 (ref >=60)

## 2024-05-06 LAB — VITAMIN D 25 HYDROXY (VIT D DEFICIENCY, FRACTURES): Vit D, 25-Hydroxy: 25 ng/mL — ABNORMAL LOW (ref 30–100)

## 2024-05-06 LAB — CYCLIC CITRUL PEPTIDE ANTIBODY, IGG: Cyclic Citrullin Peptide Ab: <16 U

## 2024-05-06 LAB — ANTI-NUCLEAR AB-TITER (ANA TITER): ANA Titer 1: 1:40 {titer} — ABNORMAL HIGH

## 2024-05-06 LAB — C-REACTIVE PROTEIN: CRP: <3 mg/L (ref <8.0)

## 2024-05-06 LAB — ANA: Anti Nuclear Antibody (ANA): POSITIVE — AB

## 2024-05-06 LAB — SEDIMENTATION RATE: Sed Rate: 29 mm/h — ABNORMAL HIGH (ref 0–20)

## 2024-05-06 LAB — RHEUMATOID FACTOR: Rheumatoid fact SerPl-aCnc: <10 [IU]/mL (ref <14)

## 2024-05-07 ENCOUNTER — Ambulatory Visit: Payer: Self-pay | Admitting: Rheumatology

## 2024-05-07 NOTE — Progress Notes (Signed)
 CBC normal, liver function remains elevated (patient should avoid all NSAIDs and alcohol use), sed rate mildly elevated, CRP normal, vitamin D  is low at 25, patient should resume taking vitamin D  2000 units daily.  ANA is low titer positive which is not significant.  Rheumatoid factor and anti-CCP (the test for rheumatoid arthritis) are negative.  Labs do not indicate an autoimmune disease flare.  Please forward results to her PCP.

## 2024-05-10 NOTE — Telephone Encounter (Signed)
 I left a message for the patient to call back

## 2024-05-10 NOTE — Telephone Encounter (Signed)
"   I discussed lab results with the patient.  Patient voiced understanding.  I advised her to follow-up with her PCP regarding elevated LFTs. "

## 2024-05-26 ENCOUNTER — Ambulatory Visit: Payer: 59 | Admitting: Nurse Practitioner

## 2024-05-26 ENCOUNTER — Encounter: Payer: Self-pay | Admitting: Nurse Practitioner

## 2024-05-26 VITALS — BP 120/82 | HR 88 | Temp 98.3°F | Ht 63.0 in | Wt 173.4 lb

## 2024-05-26 DIAGNOSIS — R3915 Urgency of urination: Secondary | ICD-10-CM

## 2024-05-26 DIAGNOSIS — R030 Elevated blood-pressure reading, without diagnosis of hypertension: Secondary | ICD-10-CM

## 2024-05-26 DIAGNOSIS — Z6830 Body mass index (BMI) 30.0-30.9, adult: Secondary | ICD-10-CM | POA: Diagnosis not present

## 2024-05-26 DIAGNOSIS — Z0000 Encounter for general adult medical examination without abnormal findings: Secondary | ICD-10-CM | POA: Diagnosis not present

## 2024-05-26 DIAGNOSIS — E66811 Obesity, class 1: Secondary | ICD-10-CM

## 2024-05-26 DIAGNOSIS — E559 Vitamin D deficiency, unspecified: Secondary | ICD-10-CM | POA: Diagnosis not present

## 2024-05-26 DIAGNOSIS — R011 Cardiac murmur, unspecified: Secondary | ICD-10-CM

## 2024-05-26 DIAGNOSIS — E6609 Other obesity due to excess calories: Secondary | ICD-10-CM | POA: Diagnosis not present

## 2024-05-26 DIAGNOSIS — Z13228 Encounter for screening for other metabolic disorders: Secondary | ICD-10-CM | POA: Diagnosis not present

## 2024-05-26 DIAGNOSIS — R319 Hematuria, unspecified: Secondary | ICD-10-CM | POA: Diagnosis not present

## 2024-05-26 DIAGNOSIS — Z2821 Immunization not carried out because of patient refusal: Secondary | ICD-10-CM

## 2024-05-26 DIAGNOSIS — E782 Mixed hyperlipidemia: Secondary | ICD-10-CM

## 2024-05-26 LAB — POCT URINALYSIS DIP (CLINITEK)
Bilirubin, UA: NEGATIVE
Glucose, UA: NEGATIVE mg/dL
Ketones, POC UA: NEGATIVE mg/dL
Leukocytes, UA: NEGATIVE
Nitrite, UA: NEGATIVE
POC PROTEIN,UA: >=300 — AB
Spec Grav, UA: >=1.03 — AB
Urobilinogen, UA: 0.2 U/dL
pH, UA: 6

## 2024-05-26 LAB — HEMOGLOBIN A1C
Est. average glucose Bld gHb Est-mCnc: 108 mg/dL
Hgb A1c MFr Bld: 5.4 % (ref 4.8–5.6)

## 2024-05-26 LAB — LIPID PANEL
Chol/HDL Ratio: 4 ratio (ref 0.0–4.4)
Cholesterol, Total: 224 mg/dL — ABNORMAL HIGH (ref 100–199)
HDL: 56 mg/dL
LDL Chol Calc (NIH): 139 mg/dL — ABNORMAL HIGH (ref 0–99)
Triglycerides: 162 mg/dL — ABNORMAL HIGH (ref 0–149)
VLDL Cholesterol Cal: 29 mg/dL (ref 5–40)

## 2024-05-26 NOTE — Assessment & Plan Note (Signed)
 Cholesterol levels were elevated at last visit, encouraged to eat a low fat diet

## 2024-05-26 NOTE — Assessment & Plan Note (Signed)
 Blood pressure is not elevated today  Variable readings with recent normal values. Possible factors include stress, diet, and measurement variability. - Monitor blood pressure daily at home, maintain log. - Send readings via MyChart after one week for review.

## 2024-05-26 NOTE — Progress Notes (Signed)
 LILLETTE Kristeen JINNY Gladis, CMA,acting as a neurosurgeon for [name], FNP.,have documented all relevant documentation on the behalf of [name], FNP,as directed by  [name], FNP while in the presence of [name], FNP.  Subjective:    Patient ID: Tiffany  Jennelle Guerrero , female    DOB: [birth year] , 39 y.o.   MRN: [id number]  Chief Complaint  Patient presents with   Annual Exam    Patient presents today for HM, Patient reports compliance with medication. Patient denies any chest pain, SOB, or headaches. Patient has no concerns today.     She has her GYN care done with [facility]    Discussed the use of AI scribe software for clinical note transcription with the patient, who gave verbal consent to proceed.  History of Present Illness Tiffany  Shaely Guerrero is a 39 year old female who presents for an annual physical exam.  She experiences intermittent pain in her arms and fingers, which began approximately a month and a half ago. A rheumatologist conducted blood work and x-rays last month but did not find a specific cause. The pain has been improving, and she continues to take vitamin D .  She has a history of elevated blood pressure readings. During a biometric screening at work in December, her blood pressure was recorded as 150/97 and 144/87. She monitors her blood pressure at work and at home, noting fluctuations with recent readings in the 130s/120s. She is unsure why her blood pressure varies.  She experiences occasional urgency and mild pain with urination, suspecting a possible urinary tract infection. She recalls having a UTI either last year or the year before.  She has a history of irregular menstrual cycles and takes medication to induce menstruation if she does not have a cycle for three months. Her last menstrual cycle was in November. She reports a history of elevated prolactin levels, which have been variable over time. She underwent a head scan in the past to  evaluate this condition.  She has a history of a heart murmur, which was previously evaluated by a cardiologist. She reports that her cardiologist told her the murmur was not present during her last cardiology visit three years ago, but that a rheumatologist recently told her it was present again.  She follows a diet primarily consisting of vegetables and home-cooked meals, with an emphasis on meat due to her spouse's anemia. Despite her dietary efforts, she struggles with weight loss, which she attributes to possible hormonal issues, including PCOS. She engages in physical activity, having previously exercised four days a week for 30 minutes at home, but has paused due to the holidays.  No shortness of breath, chest pain, difficulty swallowing, constipation, or diarrhea. Occasional heartburn-like pain and a history of urinary tract infections. She does not perform regular breast self-exams and has no family history of breast cancer.  Past Medical History:  Diagnosis Date   Encounter for annual health examination 05/22/2023   Heart murmur 2020   Mixed hyperlipidemia 05/26/2024     Family History  Problem Relation Age of Onset   Idiopathic pulmonary fibrosis Mother    Diabetes Father    Diabetes Brother    Cancer Paternal Grandfather    Alzheimer's disease Paternal Grandfather     Current Medications[1]   Allergies[2]    The patient states she uses none for birth control. Patient's last menstrual period was 03/30/2024.. Negative for Dysmenorrhea and Negative for Menorrhagia. Negative for: breast discharge, breast lump(s), breast pain  and breast self exam. Associated symptoms include abnormal vaginal bleeding. Pertinent negatives include abnormal bleeding (hematology), anxiety, decreased libido, depression, difficulty falling sleep, dyspareunia, history of infertility, nocturia, sexual dysfunction, sleep disturbances, urinary incontinence, urinary urgency, vaginal discharge and vaginal  itching. Diet regular; she is eating more meats due to her spouse having anemia. Vegetables and home meals. She has a history of PCOS and has difficulty with losing weight. The patient states her exercise level is moderate with 30 minutes at least 4 days a week. Using a youtube channel workouts.   The patient's tobacco use is: Social History Tobacco Use  Smoking Status Never   Passive exposure: Never  Smokeless Tobacco Never  She has been exposed to passive smoke. The patient's alcohol use is:  Social History   Substance and Sexual Activity  Alcohol Use Yes   Comment: occ  Additional information: Last pap 04/03/2021, next one scheduled for 04/03/2026 for HPV, has appt in March 2026.    Review of Systems  Constitutional: Negative.   HENT: Negative.    Eyes: Negative.   Respiratory: Negative.    Cardiovascular: Negative.   Gastrointestinal: Negative.   Endocrine: Negative.   Genitourinary: Negative.   Musculoskeletal: Negative.   Skin: Negative.   Allergic/Immunologic: Negative.   Neurological:  Negative for numbness.  Hematological: Negative.   Psychiatric/Behavioral: Negative.       Today's Vitals   05/26/24 0830  BP: 120/82  Pulse: 88  Temp: 98.3 F (36.8 C)  TempSrc: Oral  Weight: 173 lb 6.4 oz (78.7 kg)  Height: 5' 3 (1.6 m)  PainSc: 0-No pain   Body mass index is 30.72 kg/m.  Wt Readings from Last 3 Encounters:  05/26/24 173 lb 6.4 oz (78.7 kg)  05/04/24 174 lb 12.8 oz (79.3 kg)  12/31/23 170 lb 3.2 oz (77.2 kg)     Objective:  Physical Exam Vitals and nursing note reviewed.  Constitutional:      General: She is not in acute distress.    Appearance: Normal appearance. She is well-developed. She is obese.  HENT:     Head: Normocephalic and atraumatic.     Right Ear: Hearing, tympanic membrane, ear canal and external ear normal. There is no impacted cerumen.     Left Ear: Hearing, tympanic membrane, ear canal and external ear normal. There is no impacted  cerumen.     Nose: Nose normal.     Mouth/Throat:     Mouth: Mucous membranes are moist.  Eyes:     General: Lids are normal.     Extraocular Movements: Extraocular movements intact.     Conjunctiva/sclera: Conjunctivae normal.     Pupils: Pupils are equal, round, and reactive to light.     Funduscopic exam:    Right eye: No papilledema.        Left eye: No papilledema.  Neck:     Thyroid : No thyroid  mass.     Vascular: No carotid bruit.  Cardiovascular:     Rate and Rhythm: Normal rate and regular rhythm.     Pulses: Normal pulses.     Heart sounds: Normal heart sounds. No murmur heard. Pulmonary:     Effort: Pulmonary effort is normal. No respiratory distress.     Breath sounds: Normal breath sounds. No wheezing.  Chest:     Chest wall: No mass.  Breasts:    Tanner Score is 5.     Right: Normal. No mass or tenderness.     Left: Normal. No mass  or tenderness.  Abdominal:     General: Abdomen is flat. Bowel sounds are normal. There is no distension.     Palpations: Abdomen is soft.     Tenderness: There is no abdominal tenderness.  Genitourinary:    Comments: She will be followed by GYN Musculoskeletal:        General: No swelling. Normal range of motion.     Cervical back: Full passive range of motion without pain, normal range of motion and neck supple.     Right lower leg: No edema.     Left lower leg: No edema.  Lymphadenopathy:     Upper Body:     Right upper body: No supraclavicular, axillary or pectoral adenopathy.     Left upper body: No supraclavicular, axillary or pectoral adenopathy.  Skin:    General: Skin is warm and dry.     Capillary Refill: Capillary refill takes less than 2 seconds.  Neurological:     General: No focal deficit present.     Mental Status: She is alert and oriented to person, place, and time.     Cranial Nerves: No cranial nerve deficit.     Sensory: No sensory deficit.     Motor: No weakness.  Psychiatric:        Mood and Affect:  Mood normal.        Behavior: Behavior normal.        Thought Content: Thought content normal.        Judgment: Judgment normal.        Assessment And Plan:     Encounter for annual health examination Assessment & Plan: Routine health maintenance discussed. - Encouraged regular exercise, including HIIT and cardio. - Advised on dietary modifications for weight loss. - Recommended monthly breast self-exams. - Discussed flu prevention due to current flu season.   Vitamin D  deficiency Assessment & Plan: Managed with ergocalciferol . Rheumatologist advised continuation for bone health. - Continue ergocalciferol  50,000 units orally twice a week.   Influenza vaccination declined  Class 1 obesity due to excess calories with body mass index (BMI) of 30.0 to 30.9 in adult, unspecified whether serious comorbidity present Assessment & Plan: Difficulty losing weight despite efforts. Possible hormonal influence from PCOS. - Incorporate HIIT workouts. - Focus evening meals on protein and vegetables, avoid carbohydrates. - Increase water intake, reduce sugary drinks.   Mixed hyperlipidemia Assessment & Plan: Cholesterol levels were elevated at last visit, encouraged to eat a low fat diet  Orders: -     Lipid panel  Urinary urgency Assessment & Plan: Will check urinalysis for possible UTI.   Orders: -     POCT URINALYSIS DIP (CLINITEK)  Encounter for screening for metabolic disorder -     Hemoglobin A1c  Heart murmur Assessment & Plan: I do not hear a heart murmur today. She has been seen by Cardiology and is to f/u as needed.        Return for 1 year physical. Patient was given opportunity to ask questions. Patient verbalized understanding of the plan and was able to repeat key elements of the plan. All questions were answered to their satisfaction.   [name], FNP  I, [name], FNP, have reviewed all documentation for this visit. The documentation on 05/26/2024  for the exam, diagnosis, procedures, and orders are all accurate and complete.       [1]  Current Outpatient Medications:    medroxyPROGESTERone  (PROVERA ) 10 MG tablet, Take 1 tablet (10 mg total)  by mouth daily for 10 days. Take to induce menses if no cycle in 3 months., Disp: 10 tablet, Rfl: 3   VITAMIN D  PO, Take 2,000 Units by mouth daily., Disp: , Rfl:  [2] No Known Allergies

## 2024-05-26 NOTE — Assessment & Plan Note (Signed)
 Managed with ergocalciferol . Rheumatologist advised continuation for bone health. - Continue ergocalciferol  50,000 units orally twice a week.

## 2024-05-26 NOTE — Assessment & Plan Note (Signed)
 Routine health maintenance discussed. - Encouraged regular exercise, including HIIT and cardio. - Advised on dietary modifications for weight loss. - Recommended monthly breast self-exams. - Discussed flu prevention due to current flu season.

## 2024-05-26 NOTE — Assessment & Plan Note (Signed)
 Large blood in urine. Will send for urine culture.

## 2024-05-26 NOTE — Assessment & Plan Note (Signed)
 I do not hear a heart murmur today. She has been seen by Cardiology and is to f/u as needed.

## 2024-05-26 NOTE — Assessment & Plan Note (Signed)
 Difficulty losing weight despite efforts. Possible hormonal influence from PCOS. - Incorporate HIIT workouts. - Focus evening meals on protein and vegetables, avoid carbohydrates. - Increase water intake, reduce sugary drinks.

## 2024-05-26 NOTE — Assessment & Plan Note (Signed)
 Will check urinalysis for possible UTI.

## 2024-05-28 LAB — URINE CULTURE

## 2024-06-02 ENCOUNTER — Ambulatory Visit
Admission: RE | Admit: 2024-06-02 | Discharge: 2024-06-02 | Disposition: A | Discharge status: Home / Self Care | Source: Ambulatory Visit | Attending: Nurse Practitioner | Admitting: Nurse Practitioner

## 2024-06-02 DIAGNOSIS — R3915 Urgency of urination: Secondary | ICD-10-CM

## 2024-06-02 DIAGNOSIS — R319 Hematuria, unspecified: Secondary | ICD-10-CM

## 2024-06-09 ENCOUNTER — Ambulatory Visit: Payer: Self-pay | Admitting: Nurse Practitioner

## 2024-06-09 ENCOUNTER — Other Ambulatory Visit: Payer: Self-pay | Admitting: Nurse Practitioner

## 2024-06-09 DIAGNOSIS — N3001 Acute cystitis with hematuria: Secondary | ICD-10-CM

## 2024-06-09 MED ORDER — AMOXICILLIN 875 MG PO TABS: 875.0000 mg | ORAL_TABLET | Freq: Two times a day (BID) | ORAL | 0 refills | Status: AC

## 2024-06-10 ENCOUNTER — Other Ambulatory Visit: Payer: Self-pay | Admitting: Nurse Practitioner

## 2024-06-10 MED ORDER — FLUCONAZOLE 100 MG PO TABS: 100.0000 mg | ORAL_TABLET | Freq: Every day | ORAL | 0 refills | Status: AC

## 2024-06-21 ENCOUNTER — Encounter: Payer: Self-pay | Admitting: Nurse Practitioner

## 2024-06-21 ENCOUNTER — Telehealth: Admitting: Nurse Practitioner

## 2024-06-21 DIAGNOSIS — R21 Rash and other nonspecific skin eruption: Secondary | ICD-10-CM

## 2024-06-21 DIAGNOSIS — T7840XA Allergy, unspecified, initial encounter: Secondary | ICD-10-CM | POA: Insufficient documentation

## 2024-06-21 MED ORDER — LORATADINE 10 MG PO TABS: 10.0000 mg | ORAL_TABLET | Freq: Every day | ORAL | 2 refills | Status: AC

## 2024-06-21 MED ORDER — TRIAMCINOLONE ACETONIDE 0.5 % EX OINT: 1.0000 | TOPICAL_OINTMENT | Freq: Two times a day (BID) | CUTANEOUS | 1 refills | Status: AC

## 2024-06-21 NOTE — Assessment & Plan Note (Signed)
 Possible allergic reaction to amoxicillin , has been on a 7 day course for

## 2024-06-21 NOTE — Assessment & Plan Note (Signed)
 Rash likely due to amoxicillin , mild and itchy, no systemic allergic symptoms. Possible antibiotic sensitivity noted. - Added amoxicillin  to allergy list. - Recommended OTC Claritin  or Allegra for 3-5 days for itching. - Continue triamcinolone  cream for localized relief. - Advised emergency care if anaphylaxis symptoms occur.

## 2024-07-19 ENCOUNTER — Ambulatory Visit: Payer: 59 | Admitting: Obstetrics and Gynecology

## 2024-11-03 ENCOUNTER — Ambulatory Visit: Admitting: Rheumatology

## 2025-05-30 ENCOUNTER — Encounter: Payer: Self-pay | Admitting: Nurse Practitioner

## 2025-05-31 ENCOUNTER — Encounter: Payer: Self-pay | Admitting: Nurse Practitioner
